# Patient Record
Sex: Male | Born: 1955
Health system: Southern US, Community
[De-identification: ages and names within clinical notes are randomized; demographics above are authoritative.]

## PROBLEM LIST (undated history)

## (undated) DIAGNOSIS — K2970 Gastritis, unspecified, without bleeding: Secondary | ICD-10-CM

## (undated) DIAGNOSIS — K219 Gastro-esophageal reflux disease without esophagitis: Secondary | ICD-10-CM

## (undated) DIAGNOSIS — R0989 Other specified symptoms and signs involving the circulatory and respiratory systems: Secondary | ICD-10-CM

## (undated) DIAGNOSIS — M51379 Other intervertebral disc degeneration, lumbosacral region without mention of lumbar back pain or lower extremity pain: Secondary | ICD-10-CM

## (undated) DIAGNOSIS — K279 Peptic ulcer, site unspecified, unspecified as acute or chronic, without hemorrhage or perforation: Secondary | ICD-10-CM

## (undated) DIAGNOSIS — M5137 Other intervertebral disc degeneration, lumbosacral region: Secondary | ICD-10-CM

## (undated) HISTORY — DX: Peptic ulcer, site unspecified, unspecified as acute or chronic, without hemorrhage or perforation: K27.9

## (undated) HISTORY — PX: COLONOSCOPY: SHX174

## (undated) HISTORY — PX: ESOPHAGOGASTRODUODENOSCOPY: SHX1529

## (undated) HISTORY — PX: BACK SURGERY: SHX140

## (undated) HISTORY — DX: Gastro-esophageal reflux disease without esophagitis: K21.9

## (undated) HISTORY — DX: Gastritis, unspecified, without bleeding: K29.70

## (undated) HISTORY — DX: Other specified symptoms and signs involving the circulatory and respiratory systems: R09.89

---

## 1979-01-05 HISTORY — PX: BILROTH II PROCEDURE: SHX1232

## 1999-11-17 ENCOUNTER — Encounter (INDEPENDENT_AMBULATORY_CARE_PROVIDER_SITE_OTHER): Payer: Self-pay | Admitting: Gastroenterology

## 1999-11-17 ENCOUNTER — Encounter (INDEPENDENT_AMBULATORY_CARE_PROVIDER_SITE_OTHER): Payer: Self-pay

## 1999-11-17 ENCOUNTER — Other Ambulatory Visit: Admission: RE | Admit: 1999-11-17 | Discharge: 1999-11-17 | Payer: Self-pay | Admitting: Gastroenterology

## 2000-10-10 ENCOUNTER — Ambulatory Visit (HOSPITAL_COMMUNITY): Admission: RE | Admit: 2000-10-10 | Discharge: 2000-10-10 | Payer: Self-pay | Admitting: Unknown Physician Specialty

## 2000-10-10 ENCOUNTER — Encounter: Payer: Self-pay | Admitting: Unknown Physician Specialty

## 2000-10-12 ENCOUNTER — Encounter: Admission: RE | Admit: 2000-10-12 | Discharge: 2000-10-12 | Payer: Self-pay | Admitting: Neurosurgery

## 2000-10-12 ENCOUNTER — Encounter: Payer: Self-pay | Admitting: Neurosurgery

## 2000-10-13 ENCOUNTER — Encounter: Payer: Self-pay | Admitting: Neurosurgery

## 2000-10-13 ENCOUNTER — Ambulatory Visit (HOSPITAL_COMMUNITY): Admission: RE | Admit: 2000-10-13 | Discharge: 2000-10-14 | Payer: Self-pay | Admitting: Neurosurgery

## 2000-11-09 ENCOUNTER — Ambulatory Visit (HOSPITAL_COMMUNITY): Admission: RE | Admit: 2000-11-09 | Discharge: 2000-11-09 | Payer: Self-pay | Admitting: Neurosurgery

## 2000-11-09 ENCOUNTER — Encounter: Payer: Self-pay | Admitting: Neurosurgery

## 2000-12-06 ENCOUNTER — Encounter (HOSPITAL_COMMUNITY): Admission: RE | Admit: 2000-12-06 | Discharge: 2001-01-05 | Payer: Self-pay | Admitting: Neurosurgery

## 2001-01-05 ENCOUNTER — Encounter (HOSPITAL_COMMUNITY): Admission: RE | Admit: 2001-01-05 | Discharge: 2001-02-04 | Payer: Self-pay | Admitting: Neurosurgery

## 2004-07-21 ENCOUNTER — Ambulatory Visit (HOSPITAL_COMMUNITY): Admission: RE | Admit: 2004-07-21 | Discharge: 2004-07-21 | Payer: Self-pay | Admitting: Family Medicine

## 2004-07-22 ENCOUNTER — Ambulatory Visit (HOSPITAL_COMMUNITY): Admission: RE | Admit: 2004-07-22 | Discharge: 2004-07-22 | Payer: Self-pay | Admitting: Family Medicine

## 2005-10-17 ENCOUNTER — Emergency Department (HOSPITAL_COMMUNITY): Admission: EM | Admit: 2005-10-17 | Discharge: 2005-10-17 | Payer: Self-pay | Admitting: *Deleted

## 2006-07-06 IMAGING — NM NM HEPATO W/GB/PHARM/[PERSON_NAME]
2 series · 2 of 2 positions shown · non-contrast
Comparison: none

HISTORY: Abdominal pain

[Series 1: gb hepatobiliary scan · 3.27mm/px · 1 of 1 slices shown (1 of 2)]
[im 1/1]
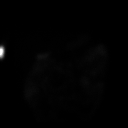

[Series 1: gb hepatobiliary scan · 3.27mm/px · 1 of 1 slices shown (2 of 2)]
[im 1/1]
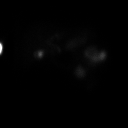

[2 of 2 positions shown; findings below may reference images not displayed]

HEPATOBILIARY SCAN WITH EJECTION FRACTION:

Hepatobiliary imaging performed using 5 mCi 3c-FFm mebrofenin.

Prompt tracer extraction from blood stream, indicating normal hepatocellular
function.
Prompt excretion into biliary tree.
Gallbladder visualized x 21 minutes.
Bowel activity seen x 30 minutes.
Duodenogastric biliary reflux noted.
No tracer retention within liver.

At 1 hour, patient ingested half-and-half, and imaging was continued for 60
minutes.
Mild emptying of tracer from gallbladder following fatty meal stimulation.
Calculated gallbladder ejection fraction is mildly decreased at 40%, although
this may be underestimated due to superimposed bowel activity.
IMPRESSION: Patent biliary tree.
Mildly decreased gallbladder ejection fraction of 40%, although this may be an
underestimation due to superimposed bowel activity at conclusion of the study.
Findings are probably equivocal for impaired gallbladder emptying.

## 2008-01-02 ENCOUNTER — Ambulatory Visit (HOSPITAL_COMMUNITY): Admission: RE | Admit: 2008-01-02 | Discharge: 2008-01-02 | Payer: Self-pay | Admitting: Family Medicine

## 2008-09-04 ENCOUNTER — Ambulatory Visit: Payer: Self-pay | Admitting: Internal Medicine

## 2008-09-04 DIAGNOSIS — K219 Gastro-esophageal reflux disease without esophagitis: Secondary | ICD-10-CM | POA: Insufficient documentation

## 2008-10-07 ENCOUNTER — Encounter: Payer: Self-pay | Admitting: Internal Medicine

## 2008-10-07 ENCOUNTER — Ambulatory Visit (HOSPITAL_COMMUNITY): Admission: RE | Admit: 2008-10-07 | Discharge: 2008-10-07 | Payer: Self-pay | Admitting: Internal Medicine

## 2008-10-07 ENCOUNTER — Ambulatory Visit: Payer: Self-pay | Admitting: Internal Medicine

## 2008-10-09 ENCOUNTER — Encounter: Payer: Self-pay | Admitting: Internal Medicine

## 2009-09-12 ENCOUNTER — Ambulatory Visit (HOSPITAL_COMMUNITY): Admission: RE | Admit: 2009-09-12 | Discharge: 2009-09-12 | Payer: Self-pay | Admitting: Family Medicine

## 2010-05-22 NOTE — Op Note (Signed)
[place]. [facility]  Patient:    Peter Williams, Peter Williams Visit Number: [id number] MRN: [id number]          Service Type: DSU Location: 3000 3041 01 Attending Physician:  Danella Penton Dictated by:   Tanya Nones. Jeral Fruit, M.D. Proc. Date: 10/13/00 Admit Date:  10/13/2000                             Operative Report  PREOPERATIVE DIAGNOSIS:  Right L5-S1 herniated disk intra- and extraforaminal.  POSTOPERATIVE DIAGNOSIS:  Right L5-S1 herniated disk intra- and extraforaminal.  OPERATION PERFORMED:  Right L5-S1 diskectomy, foraminotomy, decompression of the L5 and S1 nerve root.  Microscope.  SURGEON:  Tanya Nones. Jeral Fruit, M.D.  ASSISTANT:  Cristi Loron, M.D.  ANESTHESIA:  General.  INDICATIONS FOR PROCEDURE:  The patient was admitted because of back pain with radiation down to the right leg which is getting worse despite conservative treatment.  I saw him yesterday afternoon and the patient didnt want more conservative treatment.  He wanted to proceed with surgery.  The MRI showed herniated disk with intra- and extraforaminal component.  The ____________ was to go ahead and do intraforaminal first and then if the L5 nerve root was not totally compressed, to proceed to extraforaminal approach.  The risks were explained on history and physical.  DESCRIPTION OF PROCEDURE:  The patient was taken to the operating room and a midline incision from L5 to S1 was made.  The muscles were retracted laterally until right beyond the facet.   With the drill, we drilled the lower lamina of L5 and the upper of S1 as well as the medial facet.  With the help of the microscope we started our dissection of the  yellow ligament.  We identified the S1 nerve root and there was a modest stenosis which was taken care of with a foraminotomy and done with the Kerrison punch.  We found that the L5 nerve root was swollen and it was really absolutely tight mostly distal.  We  entered the disk space removing degenerative disk.  Using the Epstein curet, as well as the pituitary rongeurs we went laterally and removed quite a bit of degenerative disk compromising the L5 nerve root.  The same procedure was done medially.  Using the surgical dynamic direct, we proceeded more laterally until using a large nerve hook we were beyond the pedicles.  We followed the L5 nerve root and it was absolutely free extraforaminal.  Because of that, we decided not to go extraforaminal.  At the end, we had good decompression. The area was irrigated.  Valsalva maneuver was negative.  Fentanyl and Depo-Medrol were left in the epidural space and the wound was closed with Vicryl and Steri-Strips. Dictated by:   Tanya Nones. Jeral Fruit, M.D. Attending Physician:  Danella Penton DD:  10/13/00 TD:  10/13/00 Job: [phone] [id number]

## 2010-05-22 NOTE — H&P (Signed)
[place]. [facility]  Patient:    Peter Williams, Peter Williams Visit Number: [id number] MRN: [id number]          Service Type: DSU Location: 3000 3041 01 Attending Physician:  Danella Penton Dictated by:   Tanya Nones. Jeral Fruit, M.D. Admit Date:  10/13/2000                           History and Physical  HISTORY OF PRESENT ILLNESS:  Peter Williams is a gentleman who was seen yesterday as an emergency because of right leg pain.  The pain started about five weeks ago and had been doing down to the right leg as well as weakness and numbness, and was getting worse.  Despite conservative treatment plus chiropractic manipulation, he is worse.  The patient had an MRI sent to [place], and he wanted to proceed with surgery as soon as possible.  PAST MEDICAL HISTORY:  Vagotomy as well as gastrojejunostomy for peptic ulcer disease in 1983.  FAMILY HISTORY:  Unremarkable.  SOCIAL HISTORY:  The patient does not drink.  He smokes a pack a day.  REVIEW OF SYSTEMS:  Positive for a history of back and right leg pain.  PHYSICAL EXAMINATION:  GENERAL:  The patient came to my office limping from the right leg.  He was quite miserable to the point that he was unable to sit.  HEENT:  Normal.  NECK:  Normal.  CHEST:  Lungs clear.  CARDIAC:  S1 normal.  ABDOMEN:  Normal.  EXTREMITIES:  Normal normal pulses.  NEUROLOGIC:  Cranial nerves tested and were normal.  Strength 5/5 except that he has weakness of plantar and dorsiflexion of the right foot.  Sensation:  He complains of numbness which involves mostly the toes and the upper side of the right foot.  Reflexes symmetrical, with decrease of the right ankle jerk. Straight leg raising:  Right side is positive at about 10 degrees, left side about 80 degrees.  IMAGING STUDIES:  The MRI showed that he has a herniated disk at the level of 5-1 on the right side with intra- and extraforaminal component.  CLINICAL IMPRESSION:  Right  L5-S1 herniated disk.  RECOMMENDATION:  This patient is being admitted for surgery.  The procedure will be a right L5-S1 diskectomy and probably extraforaminal approach.  The surgery was fully explained to him and his wife, as well as the risks, with infection, CSF leak, bleeding, no improvement whatsoever, and need for further surgery. Dictated by:   Tanya Nones. Jeral Fruit, M.D. Attending Physician:  Danella Penton DD:  10/13/00 TD:  10/13/00 Job: [phone] [id number]

## 2013-11-26 ENCOUNTER — Other Ambulatory Visit (HOSPITAL_COMMUNITY): Payer: Self-pay | Admitting: Family Medicine

## 2013-11-26 DIAGNOSIS — R0989 Other specified symptoms and signs involving the circulatory and respiratory systems: Secondary | ICD-10-CM

## 2013-12-03 ENCOUNTER — Ambulatory Visit (HOSPITAL_COMMUNITY)
Admission: RE | Admit: 2013-12-03 | Discharge: 2013-12-03 | Disposition: A | Discharge status: Home / Self Care | Payer: BC Managed Care – PPO | Source: Ambulatory Visit | Attending: Family Medicine | Admitting: Family Medicine

## 2013-12-03 DIAGNOSIS — R0989 Other specified symptoms and signs involving the circulatory and respiratory systems: Secondary | ICD-10-CM | POA: Insufficient documentation

## 2013-12-04 ENCOUNTER — Other Ambulatory Visit (HOSPITAL_COMMUNITY): Payer: Self-pay | Admitting: Family Medicine

## 2013-12-04 DIAGNOSIS — R0989 Other specified symptoms and signs involving the circulatory and respiratory systems: Secondary | ICD-10-CM

## 2014-05-23 ENCOUNTER — Encounter: Payer: Self-pay | Admitting: Internal Medicine

## 2014-07-11 ENCOUNTER — Ambulatory Visit (INDEPENDENT_AMBULATORY_CARE_PROVIDER_SITE_OTHER): Payer: BLUE CROSS/BLUE SHIELD | Admitting: Internal Medicine

## 2014-07-11 ENCOUNTER — Encounter: Payer: Self-pay | Admitting: Internal Medicine

## 2014-07-11 VITALS — BP 122/64 | HR 80 | Ht 69.75 in | Wt 194.4 lb

## 2014-07-11 DIAGNOSIS — K219 Gastro-esophageal reflux disease without esophagitis: Secondary | ICD-10-CM

## 2014-07-11 DIAGNOSIS — K439 Ventral hernia without obstruction or gangrene: Secondary | ICD-10-CM | POA: Diagnosis not present

## 2014-07-11 MED ORDER — OMEPRAZOLE-SODIUM BICARBONATE 40-1100 MG PO CAPS: 1.0000 | ORAL_CAPSULE | Freq: Every day | ORAL | Status: DC

## 2014-07-11 NOTE — Progress Notes (Signed)
Subjective:    Patient ID: Peter JuniperMarvin J Williams, male    DOB: Nov 12, 1955, 59 y.o.   MRN: [id number] Cc: reflux HPI Has been having heartburn and regurgitation/reflux especially t night x 7-8 months. Awakens at night and coughs and chokes. OTC Zegerid helps significantly. No dysphagia.Omeprazole did not. 2 Mountain dews 24 oz a day sips on them in day and w/ supper and in evening. No dysphagia, unintentional weight loss or new medications.  Has an abdominal wall hernia and has tenderness and soreness at times. Can aggravate with lifting and coughing. Thinks its larger. Had discussed w/ Dr. Phillips OdorGolding in past and he was told that when it does get symptomatic (wasn't) he had a hernia surgeon he trusted he could see.  GI ROS o/w negative. No Known Allergies No outpatient prescriptions prior to visit.   No facility-administered medications prior to visit.   Past Medical History  Diagnosis Date  . GERD (gastroesophageal reflux disease)   . Gastritis   . Bruit of right carotid artery   . Peptic ulcer disease    Past Surgical History  Procedure Laterality Date  . Esophagogastroduodenoscopy    . Colonoscopy    . Bilroth ii procedure  1981    bilroth II, revised with bile-reflux bypass  . Back surgery      S5 and L3 vertabrae repair   History   Social History  . Marital Status: Married    Spouse Name: N/A  . Number of Children: 1  . Years of Education: N/A   Occupational History  . hvac tech    Social History Main Topics  . Smoking status: Former Smoker -- 3.00 packs/day for 8 years    Types: Cigarettes    Quit date: 07/11/2006  . Smokeless tobacco: Not on file  . Alcohol Use: No  . Drug Use: Not on file  . Sexual Activity: Not on file   Other Topics Concern  . None   Social History Narrative   Divorced   1 daughter   Visual merchandiserHVAC tech at [employer]   2 My Dew daily   07/13/2014      Family History  Problem Relation Age of Onset  . Alzheimer's disease Mother   . Diabetes  Brother     Review of Systems + back pain all other ROS negative    Objective:   Physical Exam @BP  122/64 mmHg  Pulse 80  Ht 5' 9.75" (1.772 m)  Wt 194 lb 6 oz (88.168 kg)  BMI 28.08 kg/m2@  General:  Well-developed, well-nourished and in no acute distress Eyes:  anicteric. ENT:   Mouth and posterior pharynx free of lesions. + dentures Neck:   supple w/o thyromegaly or mass.  Lungs: Clear to auscultation bilaterally. Heart:  S1S2, no rubs, murmurs, gallops. Abdomen:  soft, mildly tender midline to right of midline hernia - large egg sized and tender slightly but soft, no hepatosplenomegaly,  or mass and BS+.  Lymph:  no cervical or supraclavicular adenopathy. Extremities:   no edema, cyanosis or clubbing Skin   no rash. Neuro:  A&O x 3.  Psych:  appropriate mood and  Affect.   Data Reviewed: Prior EGD's, colonoscopy      Assessment & Plan:  Gastroesophageal reflux disease, esophagitis presence not specified - Plan: Ambulatory referral to Gastroenterology  Abdominal wall hernia - Plan: Ambulatory referral to General Surgery   1. Elevate HOB 2. Elimnate nocturnal Mt Dew 3. EGD to investigate given new or recurrent sxs and because  of prior gastrenterostomy. The risks and benefits as well as alternatives of endoscopic procedure(s) have been discussed and reviewed. All questions answered. The patient agrees to proceed. 4. Surgical referral Ovidio Kin re: abdominal wall hernia - patient ok w/ seeing surgeon here - may still discuss w/ Dr. Phillips Odor 5. Hernia info sheet and discussed signs/sxs incarceration and that it was an urent situation and to go to ED 6. ? If he could have some partial obstruction leading to this regurgitation - ? Could be related to hernia. Consider CT  WU:[id number], Chancy Hurter, MD Ovidio Kin, MD

## 2014-07-11 NOTE — Patient Instructions (Addendum)
   Please eliminate night time Mountain dew. Elevate head of bed - read reflux handouts  Start prescription Zegerid and use OTC Zegerid at bedtime as needed.  Please read handouts on GERD and hernia.  You have been scheduled for an endoscopy. Please follow written instructions given to you at your visit today. If you use inhalers (even only as needed), please bring them with you on the day of your procedure.  You have been scheduled for an appointment with Dr. [facility]vid[facility]avid [facility]. Your appointment is on 08/08/14 at 9:45AM. Please arrive at 9:15AM for registration. Make certain to bring a list of current medications, including any over the counter medications or vitamins. Also bring your co-pay if you have one as well as your insurance cards. [facility] is located at [address], [address]. Should you need to reschedule your appointment, please contact them at [phone].   I appreciate the opportunity to care for you. Stan Headarl Ashaya Raftery, MD, [facility]

## 2014-07-13 ENCOUNTER — Encounter: Payer: Self-pay | Admitting: Internal Medicine

## 2014-08-11 ENCOUNTER — Emergency Department (HOSPITAL_COMMUNITY)
Admission: EM | Admit: 2014-08-11 | Discharge: 2014-08-11 | Disposition: A | Discharge status: Home / Self Care | Payer: BLUE CROSS/BLUE SHIELD | Attending: Emergency Medicine | Admitting: Emergency Medicine

## 2014-08-11 ENCOUNTER — Encounter (HOSPITAL_COMMUNITY): Payer: Self-pay | Admitting: *Deleted

## 2014-08-11 DIAGNOSIS — Z87891 Personal history of nicotine dependence: Secondary | ICD-10-CM | POA: Diagnosis not present

## 2014-08-11 DIAGNOSIS — Y939 Activity, unspecified: Secondary | ICD-10-CM | POA: Diagnosis not present

## 2014-08-11 DIAGNOSIS — S39012A Strain of muscle, fascia and tendon of lower back, initial encounter: Secondary | ICD-10-CM | POA: Insufficient documentation

## 2014-08-11 DIAGNOSIS — Y929 Unspecified place or not applicable: Secondary | ICD-10-CM | POA: Diagnosis not present

## 2014-08-11 DIAGNOSIS — Y999 Unspecified external cause status: Secondary | ICD-10-CM | POA: Insufficient documentation

## 2014-08-11 DIAGNOSIS — X58XXXA Exposure to other specified factors, initial encounter: Secondary | ICD-10-CM | POA: Insufficient documentation

## 2014-08-11 DIAGNOSIS — Z8679 Personal history of other diseases of the circulatory system: Secondary | ICD-10-CM | POA: Diagnosis not present

## 2014-08-11 DIAGNOSIS — K219 Gastro-esophageal reflux disease without esophagitis: Secondary | ICD-10-CM | POA: Diagnosis not present

## 2014-08-11 DIAGNOSIS — Z79899 Other long term (current) drug therapy: Secondary | ICD-10-CM | POA: Insufficient documentation

## 2014-08-11 DIAGNOSIS — Z8711 Personal history of peptic ulcer disease: Secondary | ICD-10-CM | POA: Insufficient documentation

## 2014-08-11 DIAGNOSIS — S3992XA Unspecified injury of lower back, initial encounter: Secondary | ICD-10-CM | POA: Diagnosis present

## 2014-08-11 MED ORDER — CYCLOBENZAPRINE HCL 10 MG PO TABS: 10.0000 mg | ORAL_TABLET | Freq: Two times a day (BID) | ORAL | Status: DC | PRN

## 2014-08-11 MED ORDER — KETOROLAC TROMETHAMINE 30 MG/ML IJ SOLN
60.0000 mg | Freq: Once | INTRAMUSCULAR | Status: AC
  Administered 2014-08-11: 60 mg via INTRAMUSCULAR
  Filled 2014-08-11: qty 2

## 2014-08-11 MED ORDER — IBUPROFEN 800 MG PO TABS: 800.0000 mg | ORAL_TABLET | Freq: Three times a day (TID) | ORAL | Status: DC

## 2014-08-11 NOTE — Discharge Instructions (Signed)
Back Pain, Adult Low back pain is very common. About 1 in 5 people have back pain.The cause of low back pain is rarely dangerous. The pain often gets better over time.About half of people with a sudden onset of back pain feel better in just 2 weeks. About 8 in 10 people feel better by 6 weeks.  CAUSES Some common causes of back pain include:  Strain of the muscles or ligaments supporting the spine.  Wear and tear (degeneration) of the spinal discs.  Arthritis.  Direct injury to the back. DIAGNOSIS Most of the time, the direct cause of low back pain is not known.However, back pain can be treated effectively even when the exact cause of the pain is unknown.Answering your caregiver's questions about your overall health and symptoms is one of the most accurate ways to make sure the cause of your pain is not dangerous. If your caregiver needs more information, he or she may order lab work or imaging tests (X-rays or MRIs).However, even if imaging tests show changes in your back, this usually does not require surgery. HOME CARE INSTRUCTIONS For many people, back pain returns.Since low back pain is rarely dangerous, it is often a condition that people can learn to manageon their own.   Remain active. It is stressful on the back to sit or stand in one place. Do not sit, drive, or stand in one place for more than 30 minutes at a time. Take short walks on level surfaces as soon as pain allows.Try to increase the length of time you walk each day.  Do not stay in bed.Resting more than 1 or 2 days can delay your recovery.  Do not avoid exercise or work.Your body is made to move.It is not dangerous to be active, even though your back may hurt.Your back will likely heal faster if you return to being active before your pain is gone.  Pay attention to your body when you bend and lift. Many people have less discomfortwhen lifting if they bend their knees, keep the load close to their bodies,and  avoid twisting. Often, the most comfortable positions are those that put less stress on your recovering back.  Find a comfortable position to sleep. Use a firm mattress and lie on your side with your knees slightly bent. If you lie on your back, put a pillow under your knees.  Only take over-the-counter or prescription medicines as directed by your caregiver. Over-the-counter medicines to reduce pain and inflammation are often the most helpful.Your caregiver may prescribe muscle relaxant drugs.These medicines help dull your pain so you can more quickly return to your normal activities and healthy exercise.  Put ice on the injured area.  Put ice in a plastic bag.  Place a towel between your skin and the bag.  Leave the ice on for 15-20 minutes, 03-04 times a day for the first 2 to 3 days. After that, ice and heat may be alternated to reduce pain and spasms.  Ask your caregiver about trying back exercises and gentle massage. This may be of some benefit.  Avoid feeling anxious or stressed.Stress increases muscle tension and can worsen back pain.It is important to recognize when you are anxious or stressed and learn ways to manage it.Exercise is a great option. SEEK MEDICAL CARE IF:  You have pain that is not relieved with rest or medicine.  You have pain that does not improve in 1 week.  You have new symptoms.  You are generally not feeling well. SEEK   IMMEDIATE MEDICAL CARE IF:   You have pain that radiates from your back into your legs.  You develop new bowel or bladder control problems.  You have unusual weakness or numbness in your arms or legs.  You develop nausea or vomiting.  You develop abdominal pain.  You feel faint. Document Released: 12/21/2004 Document Revised: 06/22/2011 Document Reviewed: 04/24/2013 ExitCare Patient Information 2015 [employer]. This information is not intended to replace advice given to you by your health care provider. Make sure you  discuss any questions you have with your health care provider.  

## 2014-08-11 NOTE — ED Provider Notes (Signed)
CSN: [id number]     Arrival date & time 08/11/14  1015 History  This chart was scribed for non-physician practitioner working with Lyndal Pulley, MD by Leone Payor, ED Scribe. This patient was seen in room TR05C/TR05C and the patient's care was started at 10:29 AM.    Chief Complaint  Patient presents with  . Back Pain   The history is provided by the patient. No language interpreter was used.     HPI Comments: Peter Williams is a 59 y.o. male who presents to the Emergency Department complaining of 1 day of constant, gradually worsened pain to bilateral mid back down to the bilateral hips. He denies recent falls or trauma prior to onset of this episode. He reports history of back surgery in the early 2000's. He has taken 2 Aleve this morning without significant relief. He denies numbness, weakness, bowel/bladder incontinence, sob, chest pain, or fever. Worse with movement  Past Medical History  Diagnosis Date  . GERD (gastroesophageal reflux disease)   . Gastritis   . Bruit of right carotid artery   . Peptic ulcer disease    Past Surgical History  Procedure Laterality Date  . Esophagogastroduodenoscopy    . Colonoscopy    . Bilroth ii procedure  1981    bilroth II, revised with bile-reflux bypass  . Back surgery      S5 and L3 vertabrae repair   Family History  Problem Relation Age of Onset  . Alzheimer's disease Mother   . Diabetes Brother    History  Substance Use Topics  . Smoking status: Former Smoker -- 3.00 packs/day for 8 years    Types: Cigarettes    Quit date: 07/11/2006  . Smokeless tobacco: Not on file  . Alcohol Use: No    Review of Systems  Constitutional: Negative for fever.  Musculoskeletal: Positive for back pain.  Neurological: Negative for weakness and numbness.  All other systems reviewed and are negative.     Allergies  Review of patient's allergies indicates no known allergies.  Home Medications   Prior to Admission medications   Medication  Sig Start Date End Date Taking? Authorizing Provider  Ascorbic Acid (VITAMIN C) 1000 MG tablet Take 1,000 mg by mouth daily.    Historical Provider, MD  Omeprazole-Sodium Bicarbonate (ZEGERID OTC PO) Take 20 mg by mouth at bedtime as needed.    Historical Provider, MD  omeprazole-sodium bicarbonate (ZEGERID) 40-1100 MG per capsule Take 1 capsule by mouth daily before breakfast. 07/11/14   Iva Boop, MD   BP 133/83 mmHg  Pulse 78  Temp(Src) 97.7 F (36.5 C) (Oral)  Resp 18  Ht 6' (1.829 m)  Wt 196 lb (88.905 kg)  BMI 26.58 kg/m2  SpO2 100% Physical Exam  Constitutional: He is oriented to person, place, and time. He appears well-developed and well-nourished. No distress.  HENT:  Head: Normocephalic and atraumatic.  Eyes: Conjunctivae and EOM are normal.  Neck: Neck supple. No tracheal deviation present.  Cardiovascular: Normal rate.   Pulmonary/Chest: Effort normal. No respiratory distress.  Musculoskeletal: Normal range of motion.  Tender to lumbar spine and bilateral paraspinal area. Good strength and sensation to the area  Neurological: He is alert and oriented to person, place, and time. He exhibits normal muscle tone. Coordination normal.  Skin: Skin is warm and dry.  Psychiatric: He has a normal mood and affect. His behavior is normal.  Nursing note and vitals reviewed.   ED Course  Procedures (including critical care time)  DIAGNOSTIC STUDIES: Oxygen Saturation is 100% on RA, normal by my interpretation.    COORDINATION OF CARE: 10:32 AM Discussed treatment plan with pt at bedside and pt agreed to plan.   Labs Review Labs Reviewed - No data to display  Imaging Review No results found.   EKG Interpretation None      MDM   Final diagnoses:  Lumbar strain, initial encounter    Considered dissection although seems more likely musculoskeletal. Discussed follow up with DR. Jeral Fruit for continued symptoms. Pt requesting spine injections. Discussed with that that  is not something that we do.  Will given ibuprofen and flexeril for symptomatic relief  I personally performed the services described in this documentation, which was scribed in my presence. The recorded information has been reviewed and is accurate.   Teressa Lower, NP 08/11/14 1048  Lyndal Pulley, MD 08/11/14 [phone]

## 2014-08-11 NOTE — ED Notes (Signed)
Pt reports pain to mid back to hips, started yesterday. Hx of same and back surgery. Ambulatory at triage.

## 2014-08-11 NOTE — ED Notes (Signed)
Declined W/C at D/C and was escorted to lobby by RN. 

## 2014-08-20 ENCOUNTER — Ambulatory Visit (AMBULATORY_SURGERY_CENTER): Payer: BLUE CROSS/BLUE SHIELD | Admitting: Internal Medicine

## 2014-08-20 ENCOUNTER — Encounter: Payer: Self-pay | Admitting: Internal Medicine

## 2014-08-20 VITALS — BP 75/45 | HR 71 | Temp 97.0°F | Resp 13 | Ht 69.0 in | Wt 194.0 lb

## 2014-08-20 DIAGNOSIS — K219 Gastro-esophageal reflux disease without esophagitis: Secondary | ICD-10-CM

## 2014-08-20 DIAGNOSIS — K2960 Other gastritis without bleeding: Secondary | ICD-10-CM

## 2014-08-20 MED ORDER — SODIUM CHLORIDE 0.9 % IV SOLN: 500.0000 mL | INTRAVENOUS | Status: DC

## 2014-08-20 MED ORDER — METOCLOPRAMIDE HCL 10 MG PO TABS
10.0000 mg | ORAL_TABLET | Freq: Every day | ORAL | Status: DC
Start: 2014-08-20 — End: 2014-12-14

## 2014-08-20 NOTE — Progress Notes (Signed)
Transferred to recovery room. A/O x3, pleased with MAC.  VSS.  Report to Jane, RN. 

## 2014-08-20 NOTE — Patient Instructions (Addendum)
There is gastritis as I would expect after your surgery. Bile reflux into the stomach causes it.  All else ok.  I am starting metaclopramide at bedtime - it empties the stomach and tightens the junction of the esophagus and stomach. Please avoid eating and drinking at least 3 hours before bedtime and keep the head of your bed raised 4-6 inches.  Please call soon and make an appointment to see me in October.  I appreciate the opportunity to care for you.  Iva Boop, MD, FACG   YOU HAD AN ENDOSCOPIC PROCEDURE TODAY AT THE [facility]:   Refer to the procedure report that was given to you for any specific questions about what was found during the examination.  If the procedure report does not answer your questions, please call your gastroenterologist to clarify.  If you requested that your care partner not be given the details of your procedure findings, then the procedure report has been included in a sealed envelope for you to review at your convenience later.  YOU SHOULD EXPECT: Some feelings of bloating in the abdomen. Passage of more gas than usual.  Walking can help get rid of the air that was put into your GI tract during the procedure and reduce the bloating. If you had a lower endoscopy (such as a colonoscopy or flexible sigmoidoscopy) you may notice spotting of blood in your stool or on the toilet paper. If you underwent a bowel prep for your procedure, you may not have a normal bowel movement for a few days.  Please Note:  You might notice some irritation and congestion in your nose or some drainage.  This is from the oxygen used during your procedure.  There is no need for concern and it should clear up in a day or so.  SYMPTOMS TO REPORT IMMEDIATELY:   Following upper endoscopy (EGD)  Vomiting of blood or coffee ground material  New chest pain or pain under the shoulder blades  Painful or persistently difficult swallowing  New shortness of  breath  Fever of 100F or higher  Black, tarry-looking stools  For urgent or emergent issues, a gastroenterologist can be reached at any hour by calling [phone].   DIET: Your first meal following the procedure should be a small meal and then it is ok to progress to your normal diet. Heavy or fried foods are harder to digest and may make you feel nauseous or bloated.  Likewise, meals heavy in dairy and vegetables can increase bloating.  Drink plenty of fluids but you should avoid alcoholic beverages for 24 hours.  ACTIVITY:  You should plan to take it easy for the rest of today and you should NOT DRIVE or use heavy machinery until tomorrow (because of the sedation medicines used during the test).    FOLLOW UP: Our staff will call the number listed on your records the next business day following your procedure to check on you and address any questions or concerns that you may have regarding the information given to you following your procedure. If we do not reach you, we will leave a message.  However, if you are feeling well and you are not experiencing any problems, there is no need to return our call.  We will assume that you have returned to your regular daily activities without incident.  If any biopsies were taken you will be contacted by phone or by letter within the next 1-3 weeks.  Please call us at [phone] if you have not heard about the biopsies in 3 weeks.    SIGNATURES/CONFIDENTIALITY: You and/or your care partner have signed paperwork which will be entered into your electronic medical record.  These signatures attest to the fact that that the information above on your After Visit Summary has been reviewed and is understood.  Full responsibility of the confidentiality of this discharge information lies with you and/or your care-partner.   Gastritis information given.

## 2014-08-20 NOTE — Op Note (Signed)
[facility] [address] [place], [address]   ENDOSCOPY PROCEDURE REPORT  PATIENT: Peter Williams, Peter Williams  MR#: [id number] BIRTHDATE: 1955/01/28 , 59  yrs. old GENDER: male ENDOSCOPIST: Iva Boop, MD, [facility] PROCEDURE DATE:  08/20/2014 PROCEDURE:  EGD, diagnostic ASA CLASS:     Class I INDICATIONS:  regurgitation and reflux. MEDICATIONS: Propofol 160 TOPICAL ANESTHETIC: none  DESCRIPTION OF PROCEDURE: After the risks benefits and alternatives of the procedure were thoroughly explained, informed consent was obtained.  The LB [id number] endoscope was introduced through the mouth and advanced to the proximal jejunum , Without limitations.  The instrument was slowly withdrawn as the mucosa was fully examined.    1) s/p Bilroth II with expected bile gastritis 2) otherwise normal exam.  Retroflexed views revealed as previously described.     The scope was then withdrawn from the patient and the procedure completed.  COMPLICATIONS: There were no immediate complications.  ENDOSCOPIC IMPRESSION: 1) s/p Bilroth II with expected bile gastritis 2) otherwise normal exam  RECOMMENDATIONS: Trial of bedtime metaclopramide 10 mg and reflux precautions to continue Call soon to make appointment to see me in October   eSigned:  Iva Boop, MD, [facility] 08/20/2014 10:08 AM    CC: Dr. Assunta Found and The Patient

## 2014-08-21 ENCOUNTER — Telehealth: Payer: Self-pay | Admitting: *Deleted

## 2014-08-21 NOTE — Telephone Encounter (Signed)
  Follow up Call-  Call back number 08/20/2014  Post procedure Call Back phone  # [phone]  Permission to leave phone message Yes     Patient questions:  Do you have a fever, pain , or abdominal swelling? No. Pain Score  0 *  Have you tolerated food without any problems? Yes.    Have you been able to return to your normal activities? Yes.    Do you have any questions about your discharge instructions: Diet   No. Medications  No. Follow up visit  No.  Do you have questions or concerns about your Care? No.  Actions: * If pain score is 4 or above: No action needed, pain <4.

## 2014-10-11 ENCOUNTER — Telehealth: Payer: Self-pay | Admitting: Internal Medicine

## 2014-10-11 ENCOUNTER — Ambulatory Visit: Payer: BLUE CROSS/BLUE SHIELD | Admitting: Internal Medicine

## 2014-10-11 NOTE — Telephone Encounter (Signed)
No Charge 

## 2014-10-11 NOTE — Telephone Encounter (Signed)
Patient had to cancel this appointment. He could not get off of work. He will call back to reschedule. Do you want to charge late cancellation fee? Thanks.

## 2014-12-14 ENCOUNTER — Other Ambulatory Visit: Payer: Self-pay | Admitting: Internal Medicine

## 2015-02-09 ENCOUNTER — Other Ambulatory Visit: Payer: Self-pay | Admitting: Internal Medicine

## 2015-03-21 ENCOUNTER — Telehealth: Payer: Self-pay

## 2015-03-21 NOTE — Telephone Encounter (Signed)
Left message for patient to call me back to discuss his zegerid rx, got a prior authorization.  Need to know if he is on the rx kind or OTC zegerid and what else has he tried.

## 2015-03-21 NOTE — Telephone Encounter (Signed)
Patient called back and he is on the prescription zegerid because insurance wouldn't cover the OTC type.  Only other rx he has tried is zantac OTC.  Will do cover my meds and await there answer.

## 2015-03-24 NOTE — Telephone Encounter (Signed)
Checked the covermymeds account and no response from them yet.

## 2015-03-25 NOTE — Telephone Encounter (Signed)
Zegerid has been denied.  They will cover esomeprazole magnesium, lansoprazole, omeprazole, pantoprazole sodium and rabeprazole.  He has to try and fail two of these to get zegerid approved.  Please advise Sir, thank you.

## 2015-03-28 MED ORDER — OMEPRAZOLE 40 MG PO CPDR: 40.0000 mg | DELAYED_RELEASE_CAPSULE | Freq: Every day | ORAL | Status: DC

## 2015-03-28 NOTE — Telephone Encounter (Signed)
Spoke to patient and he wants to try the omeprazole and will see Dr Leone PayorGessner in May.  He said the zegerid OTC cost too much.

## 2015-03-28 NOTE — Telephone Encounter (Signed)
Can try omeprazole 40 mg qAM breakfast x 1 year # 90 w/ 3 RF or 30/11  He can also purchase OTC Zegerid if desied

## 2015-04-11 ENCOUNTER — Other Ambulatory Visit: Payer: Self-pay | Admitting: Internal Medicine

## 2015-04-14 NOTE — Telephone Encounter (Signed)
Ok to refill Sir? Thank you. 

## 2015-04-14 NOTE — Telephone Encounter (Signed)
OK to refill x 3 but I will need to see him for more refills

## 2015-05-20 ENCOUNTER — Ambulatory Visit (INDEPENDENT_AMBULATORY_CARE_PROVIDER_SITE_OTHER): Payer: BLUE CROSS/BLUE SHIELD | Admitting: Internal Medicine

## 2015-05-20 ENCOUNTER — Encounter: Payer: Self-pay | Admitting: Internal Medicine

## 2015-05-20 VITALS — BP 122/70 | HR 76 | Ht 69.75 in | Wt 196.4 lb

## 2015-05-20 DIAGNOSIS — K432 Incisional hernia without obstruction or gangrene: Secondary | ICD-10-CM

## 2015-05-20 DIAGNOSIS — K219 Gastro-esophageal reflux disease without esophagitis: Secondary | ICD-10-CM | POA: Diagnosis not present

## 2015-05-20 MED ORDER — METOCLOPRAMIDE HCL 10 MG PO TABS: 10.0000 mg | ORAL_TABLET | Freq: Every day | ORAL | Status: DC

## 2015-05-20 NOTE — Progress Notes (Signed)
   Subjective:    Patient ID: Peter Williams, male    DOB: 09/02/1955, 60 y.o.   MRN: [id number] Cc: f/u reflux and heartburn and ? Of hernia HPI  No sig heartburn or reflux on current regimen of PPI and nocturnal Reglan. He has hx BII gastric surgery. He is asking about the ventral hernia and repair - he last saw me in 2016 and also saw Dr. Ovidio Kinavid Newman. He has some soreness at times but no sxs of incarceration. 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  Medications, allergies, past medical history, past surgical history, family history and social history are reviewed and updated in the EMR.  Review of Systems As above    Objective:   Physical Exam BP 122/70 mmHg  Pulse 76  Ht 5' 9.75" (1.772 m)  Wt 196 lb 6 oz (89.075 kg)  BMI 28.37 kg/m2 Small right paramedian mildly tender upper abd incisional hernia  No signs tardive dyskinesia    Assessment & Plan:   Encounter Diagnoses  Name Primary?  . Ventral incisional hernia Yes  . Gastroesophageal reflux disease, esophagitis presence not specified     Stay on PPI and reglan Se me 9-12 mos - needs f/u due to Reglan use even if Asx Dr. Ezzard StandingNewman to reassess - leans towards fixing hernia but concerned about how much time off  - patient will call for an appt   15 minutes time spent with patient > half in counseling coordination of care  QM:VHQIONGCc:GOLDING, Chancy HurterJOHN CABOT, MD

## 2015-05-20 NOTE — Patient Instructions (Signed)
  We have sent the following medications to your pharmacy for you to pick up at your convenience: reglan   Please follow up with Dr Ezzard StandingNewman regarding your hernia.   Follow up with Dr Leone PayorGessner in 6-12 months.    I appreciate the opportunity to care for you. Stan Headarl Gessner, MD, [facility]

## 2015-05-25 ENCOUNTER — Encounter: Payer: Self-pay | Admitting: Internal Medicine

## 2015-08-28 DIAGNOSIS — Z6826 Body mass index (BMI) 26.0-26.9, adult: Secondary | ICD-10-CM | POA: Diagnosis not present

## 2015-08-28 DIAGNOSIS — M549 Dorsalgia, unspecified: Secondary | ICD-10-CM | POA: Diagnosis not present

## 2015-08-28 DIAGNOSIS — R030 Elevated blood-pressure reading, without diagnosis of hypertension: Secondary | ICD-10-CM | POA: Diagnosis not present

## 2015-08-28 DIAGNOSIS — M5416 Radiculopathy, lumbar region: Secondary | ICD-10-CM | POA: Diagnosis not present

## 2015-09-04 DIAGNOSIS — M5416 Radiculopathy, lumbar region: Secondary | ICD-10-CM | POA: Diagnosis not present

## 2015-09-05 DIAGNOSIS — M47816 Spondylosis without myelopathy or radiculopathy, lumbar region: Secondary | ICD-10-CM | POA: Diagnosis not present

## 2015-09-05 DIAGNOSIS — M5416 Radiculopathy, lumbar region: Secondary | ICD-10-CM | POA: Diagnosis not present

## 2015-09-18 DIAGNOSIS — R030 Elevated blood-pressure reading, without diagnosis of hypertension: Secondary | ICD-10-CM | POA: Diagnosis not present

## 2015-09-18 DIAGNOSIS — M5416 Radiculopathy, lumbar region: Secondary | ICD-10-CM | POA: Diagnosis not present

## 2015-09-18 DIAGNOSIS — Z6827 Body mass index (BMI) 27.0-27.9, adult: Secondary | ICD-10-CM | POA: Diagnosis not present

## 2015-10-13 DIAGNOSIS — R030 Elevated blood-pressure reading, without diagnosis of hypertension: Secondary | ICD-10-CM | POA: Diagnosis not present

## 2015-10-13 DIAGNOSIS — Z6827 Body mass index (BMI) 27.0-27.9, adult: Secondary | ICD-10-CM | POA: Diagnosis not present

## 2015-10-13 DIAGNOSIS — M5416 Radiculopathy, lumbar region: Secondary | ICD-10-CM | POA: Diagnosis not present

## 2015-10-13 DIAGNOSIS — M5126 Other intervertebral disc displacement, lumbar region: Secondary | ICD-10-CM | POA: Diagnosis not present

## 2015-10-22 DIAGNOSIS — Z6827 Body mass index (BMI) 27.0-27.9, adult: Secondary | ICD-10-CM | POA: Diagnosis not present

## 2015-10-22 DIAGNOSIS — R030 Elevated blood-pressure reading, without diagnosis of hypertension: Secondary | ICD-10-CM | POA: Diagnosis not present

## 2015-10-22 DIAGNOSIS — M5416 Radiculopathy, lumbar region: Secondary | ICD-10-CM | POA: Diagnosis not present

## 2015-10-22 DIAGNOSIS — M5126 Other intervertebral disc displacement, lumbar region: Secondary | ICD-10-CM | POA: Diagnosis not present

## 2015-11-17 IMAGING — US US CAROTID DUPLEX BILAT
1 series · 13 of 24 positions shown · non-contrast
Comparison: No prior for comparison

CLINICAL DATA: 58-year-old male with right carotid bruit.

Cardiovascular risk factors include tobacco use.
EXAM:
BILATERAL CAROTID DUPLEX ULTRASOUND
TECHNIQUE: Gray scale imaging, color Doppler and duplex ultrasound were
performed of bilateral carotid and vertebral arteries in the neck.

[Series 1: us carotid duplex bilat · 0.07mm/px · 13 of 72 slices shown]
[im 1/72]
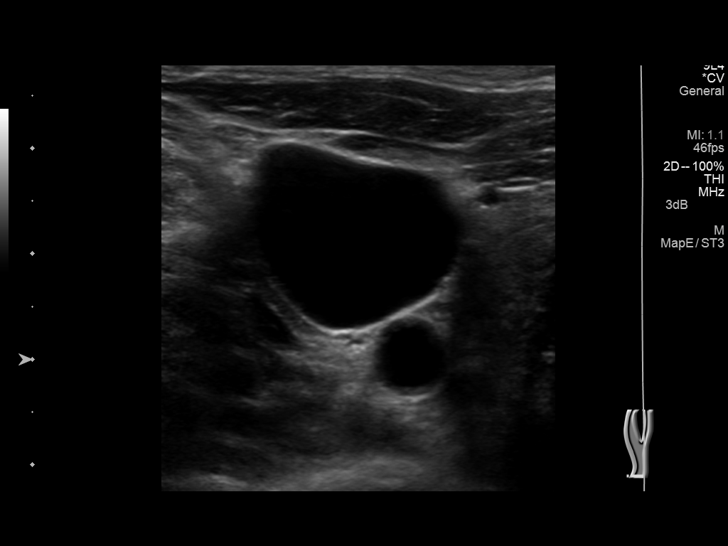
[im 7/72]
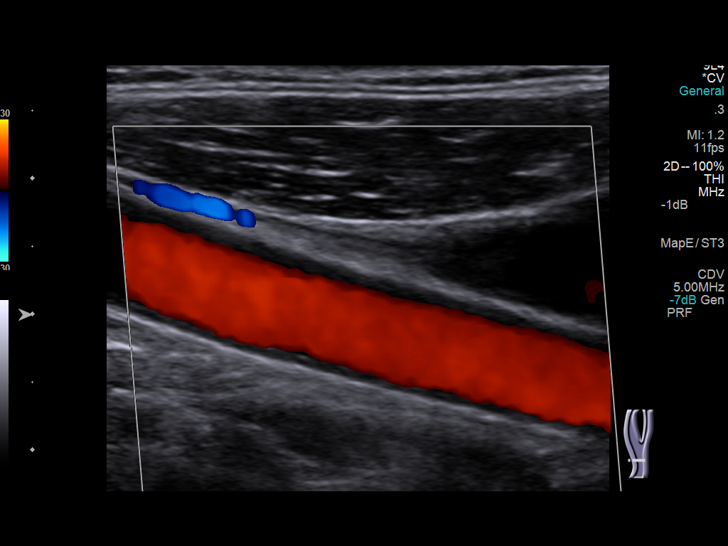
[im 13/72]
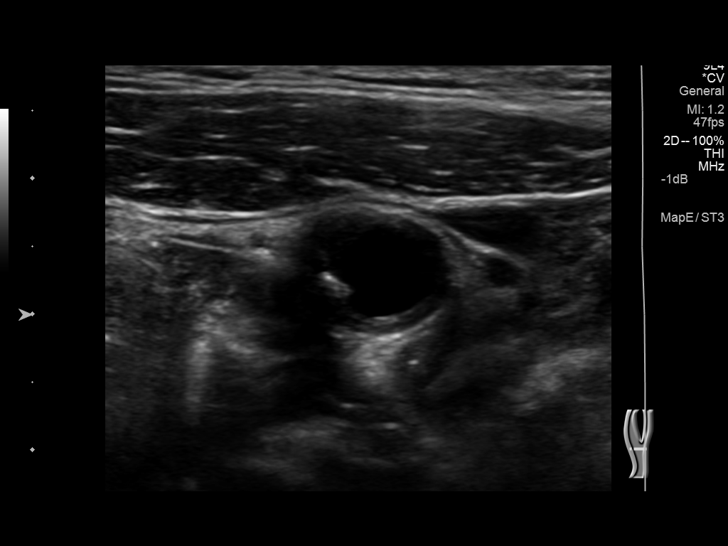
[im 19/72]
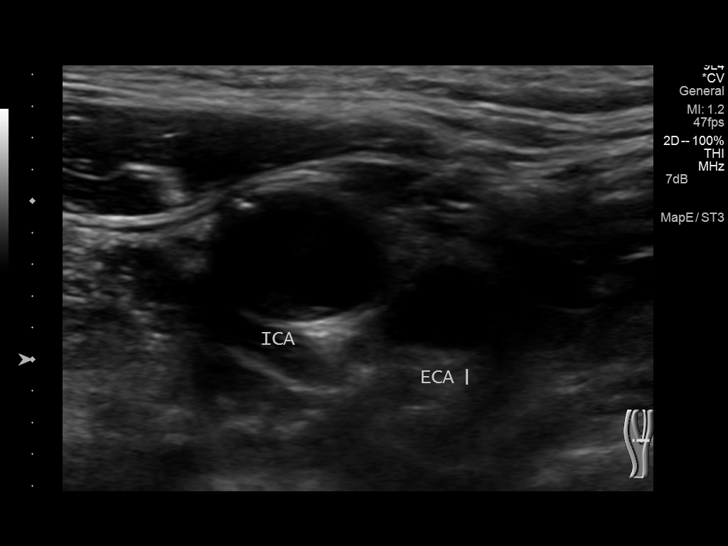
[im 25/72]
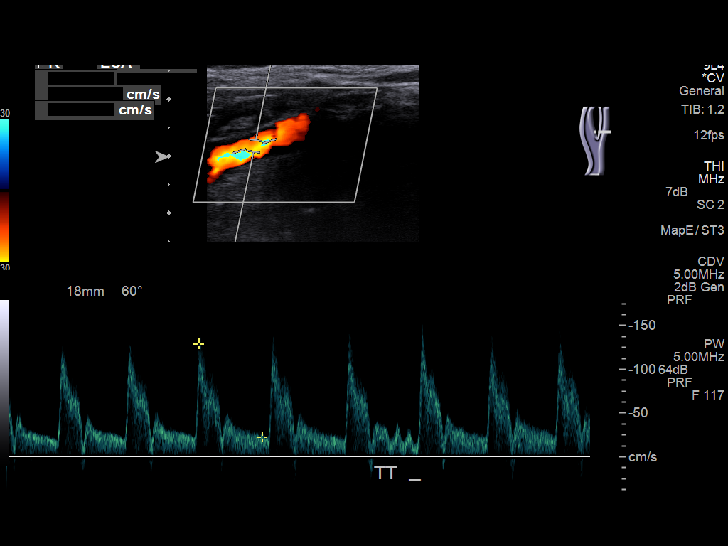
[im 31/72]
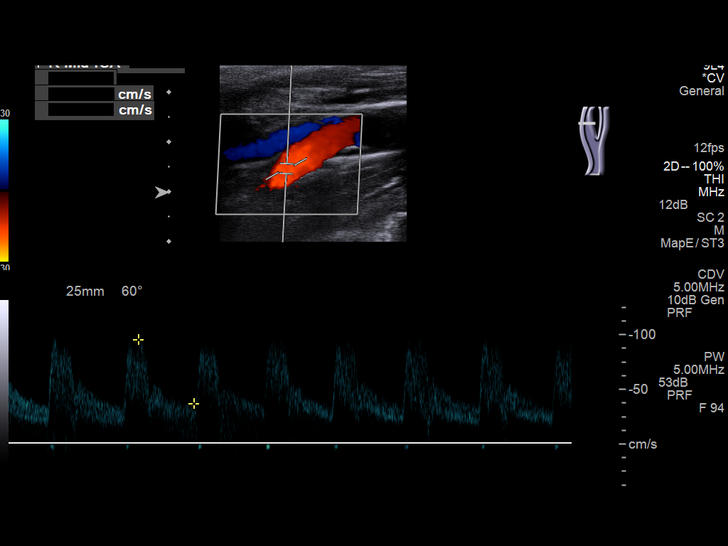
[im 38/72]
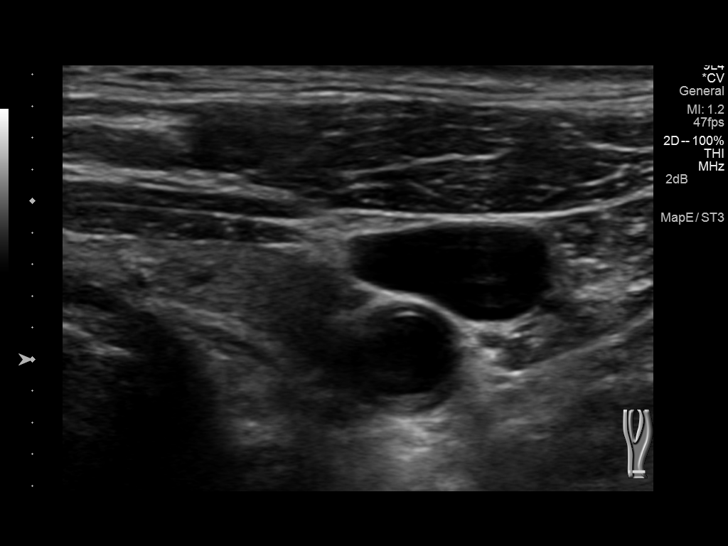
[im 41/72]
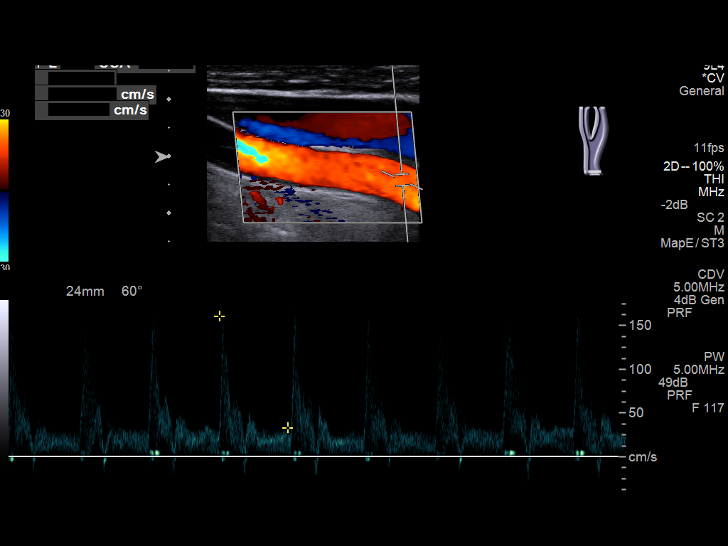
[im 47/72]
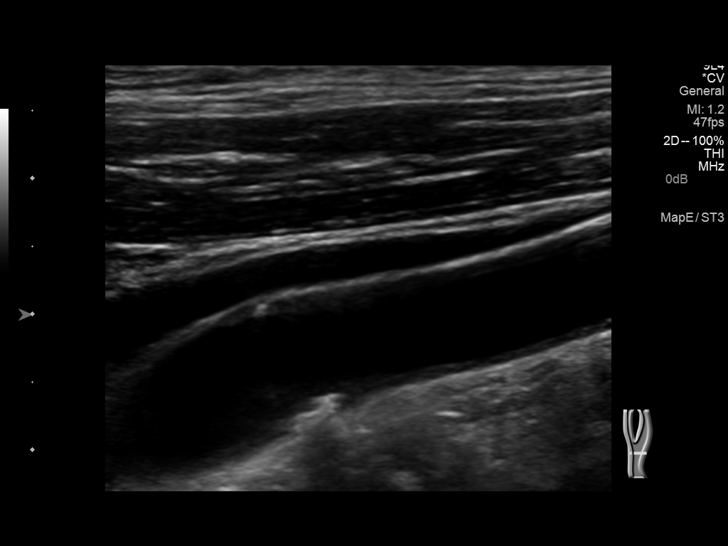
[im 53/72]
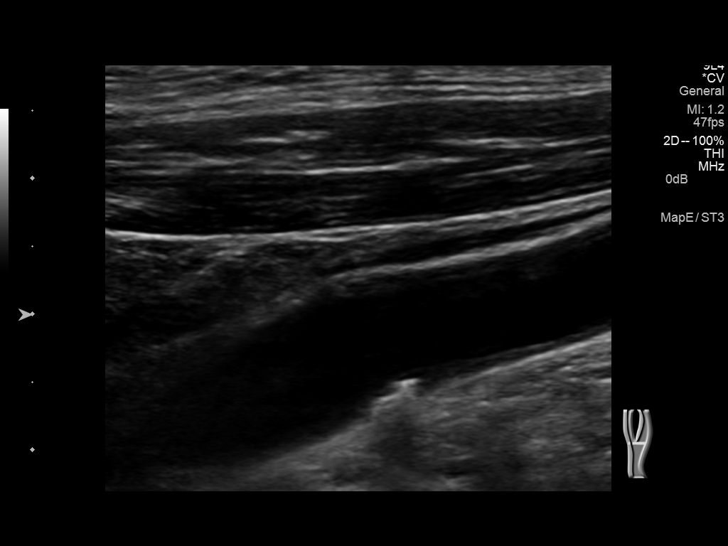
[im 59/72]
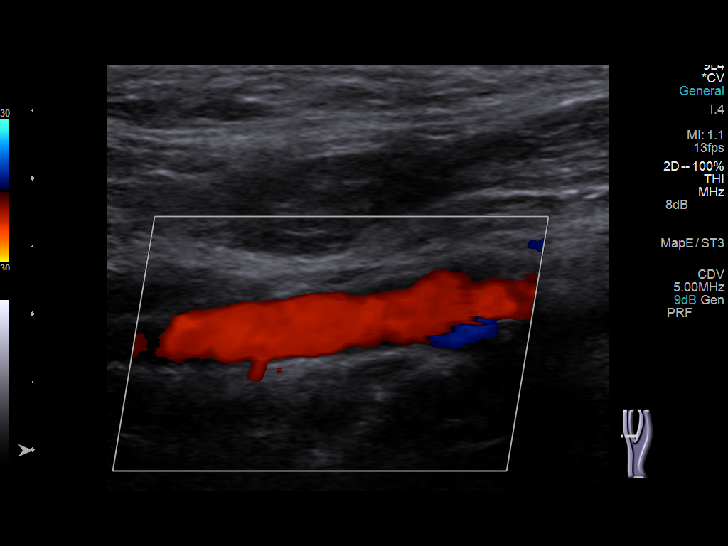
[im 65/72]
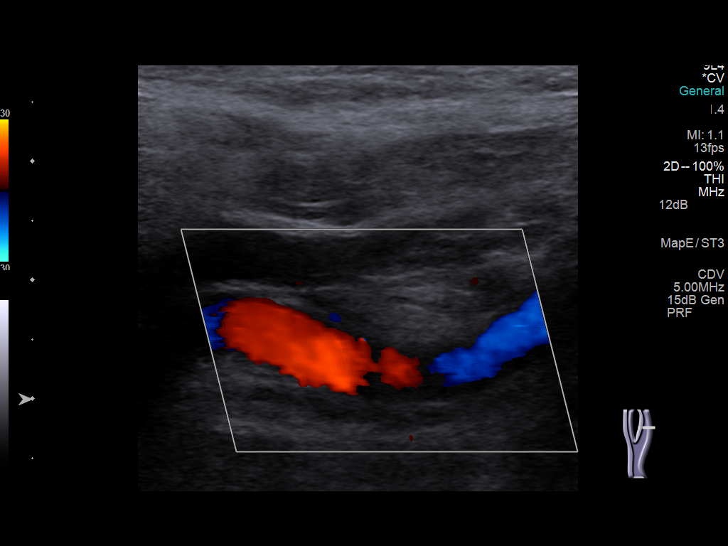
[im 72/72]
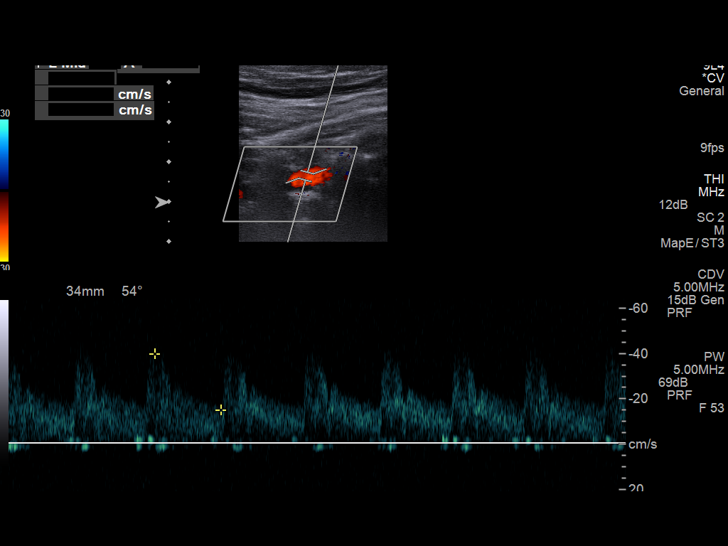

[13 of 24 positions shown; findings below may reference images not displayed]

FINDINGS: Criteria: Quantification of carotid stenosis is based on velocity
parameters that correlate the residual internal carotid diameter
with NASCET-based stenosis levels, using the diameter of the distal
internal carotid lumen as the denominator for stenosis measurement.

The following velocity measurements were obtained:

RIGHT

ICA:  Systolic 96 cm/sec, Diastolic 37 cm/sec

CCA:  102 cm/sec

SYSTOLIC ICA/CCA RATIO:

DIASTOLIC ICA/CCA RATIO:

ECA:  145 cm/sec

LEFT

ICA:  Systolic 126 cm/sec, Diastolic 24 cm/sec

CCA:  109 cm/sec

SYSTOLIC ICA/CCA RATIO:

DIASTOLIC ICA/CCA RATIO:

ECA:  93 cm/sec

RIGHT CAROTID ARTERY: Unremarkable appearance of the right common
carotid artery with intermediate waveform maintained. No significant
atherosclerotic disease with mild intimal thickening.

Mild heterogeneous plaque at the right carotid bifurcation extending
into the right ICA with some calcification. Right ICA maintains low
resistance waveform.

RIGHT VERTEBRAL ARTERY: Antegrade flow with low resistance waveform.

LEFT CAROTID ARTERY: Unremarkable appearance of the left common
carotid artery without significant atherosclerotic disease. Minimal
intimal thickening. Intermediate waveform maintained.

Mild heterogeneous atherosclerotic disease at the carotid
bifurcation extending into the left ICA with some calcifications.

Low resistance waveform of the left ICA.

LEFT VERTEBRAL ARTERY:  Antegrade flow with low resistance waveform.
IMPRESSION: Color duplex indicates minimal heterogeneous and calcified plaque,
with no hemodynamically significant stenosis by duplex criteria in
the extracranial cerebrovascular circulation.

## 2015-12-02 DIAGNOSIS — M5416 Radiculopathy, lumbar region: Secondary | ICD-10-CM | POA: Diagnosis not present

## 2015-12-02 DIAGNOSIS — M5126 Other intervertebral disc displacement, lumbar region: Secondary | ICD-10-CM | POA: Diagnosis not present

## 2016-02-16 ENCOUNTER — Other Ambulatory Visit: Payer: Self-pay | Admitting: Gastroenterology

## 2016-05-24 ENCOUNTER — Other Ambulatory Visit: Payer: Self-pay | Admitting: Internal Medicine

## 2016-05-26 DIAGNOSIS — E782 Mixed hyperlipidemia: Secondary | ICD-10-CM | POA: Diagnosis not present

## 2016-05-26 DIAGNOSIS — Z1389 Encounter for screening for other disorder: Secondary | ICD-10-CM | POA: Diagnosis not present

## 2016-05-26 DIAGNOSIS — Z0001 Encounter for general adult medical examination with abnormal findings: Secondary | ICD-10-CM | POA: Diagnosis not present

## 2016-05-26 DIAGNOSIS — K432 Incisional hernia without obstruction or gangrene: Secondary | ICD-10-CM | POA: Diagnosis not present

## 2016-05-26 DIAGNOSIS — Z6828 Body mass index (BMI) 28.0-28.9, adult: Secondary | ICD-10-CM | POA: Diagnosis not present

## 2016-05-28 DIAGNOSIS — D649 Anemia, unspecified: Secondary | ICD-10-CM | POA: Diagnosis not present

## 2016-05-28 DIAGNOSIS — R7309 Other abnormal glucose: Secondary | ICD-10-CM | POA: Diagnosis not present

## 2016-06-04 DIAGNOSIS — D649 Anemia, unspecified: Secondary | ICD-10-CM | POA: Diagnosis not present

## 2016-06-17 DIAGNOSIS — E538 Deficiency of other specified B group vitamins: Secondary | ICD-10-CM | POA: Diagnosis not present

## 2016-08-03 DIAGNOSIS — E538 Deficiency of other specified B group vitamins: Secondary | ICD-10-CM | POA: Diagnosis not present

## 2016-08-03 DIAGNOSIS — E663 Overweight: Secondary | ICD-10-CM | POA: Diagnosis not present

## 2016-08-03 DIAGNOSIS — Z1389 Encounter for screening for other disorder: Secondary | ICD-10-CM | POA: Diagnosis not present

## 2016-08-03 DIAGNOSIS — Z6829 Body mass index (BMI) 29.0-29.9, adult: Secondary | ICD-10-CM | POA: Diagnosis not present

## 2016-08-03 DIAGNOSIS — M25562 Pain in left knee: Secondary | ICD-10-CM | POA: Diagnosis not present

## 2016-08-03 DIAGNOSIS — M1712 Unilateral primary osteoarthritis, left knee: Secondary | ICD-10-CM | POA: Diagnosis not present

## 2016-08-20 ENCOUNTER — Other Ambulatory Visit: Payer: Self-pay | Admitting: Internal Medicine

## 2016-08-23 NOTE — Telephone Encounter (Signed)
May I refill Sir? 

## 2016-08-23 NOTE — Telephone Encounter (Signed)
OK to refill x 3 Have him set up an October appointment Will need to be seen for more beyond that

## 2016-08-23 NOTE — Telephone Encounter (Signed)
Sent in 3 months worth and noted for patient to call us for appointment in October.

## 2016-11-22 ENCOUNTER — Other Ambulatory Visit: Payer: Self-pay | Admitting: Internal Medicine

## 2016-11-23 NOTE — Telephone Encounter (Signed)
Ok to refill 

## 2016-11-24 NOTE — Telephone Encounter (Signed)
Needs to see me before we refill

## 2016-11-29 ENCOUNTER — Telehealth: Payer: Self-pay | Admitting: Internal Medicine

## 2016-11-29 MED ORDER — METOCLOPRAMIDE HCL 10 MG PO TABS: 10.0000 mg | ORAL_TABLET | Freq: Every day | ORAL | 0 refills | Status: DC

## 2016-11-29 NOTE — Telephone Encounter (Signed)
Okay to refill Sir, thanks.

## 2016-11-29 NOTE — Telephone Encounter (Signed)
X 2 mos

## 2016-11-29 NOTE — Telephone Encounter (Signed)
Metoclopramide refilled as requested. Patient has appointment in January.

## 2017-01-23 ENCOUNTER — Other Ambulatory Visit: Payer: Self-pay | Admitting: Internal Medicine

## 2017-01-25 ENCOUNTER — Encounter: Payer: Self-pay | Admitting: Internal Medicine

## 2017-01-25 ENCOUNTER — Ambulatory Visit: Payer: BLUE CROSS/BLUE SHIELD | Admitting: Internal Medicine

## 2017-01-25 NOTE — Telephone Encounter (Signed)
Refill for 3 mos ok

## 2017-01-25 NOTE — Telephone Encounter (Signed)
Patient's reglan refilled as approved and patient to come in and see Dr Leone PayorGessner on Friday. Patient informed that it has been sent in.

## 2017-01-25 NOTE — Telephone Encounter (Signed)
Please advise if okay to refill, had appointment for today which we are having to r/s due to a schedule change on our part.

## 2017-01-28 ENCOUNTER — Ambulatory Visit: Payer: BLUE CROSS/BLUE SHIELD | Admitting: Internal Medicine

## 2017-01-28 ENCOUNTER — Encounter: Payer: Self-pay | Admitting: Internal Medicine

## 2017-01-28 VITALS — BP 160/82 | HR 73 | Ht 73.0 in | Wt 211.0 lb

## 2017-01-28 DIAGNOSIS — K219 Gastro-esophageal reflux disease without esophagitis: Secondary | ICD-10-CM | POA: Diagnosis not present

## 2017-01-28 DIAGNOSIS — K432 Incisional hernia without obstruction or gangrene: Secondary | ICD-10-CM

## 2017-01-28 NOTE — Patient Instructions (Signed)
Try taking 2 tablespoons of Gaviscon daily at bedtime.  Please update Dr Leone PayorGessner in several weeks with how your doing.   We are giving you a handout to read and follow about raising the head of your bed and don't lie down too soon after eating.   [facility] your [facility].  They contact you with an appointment date/time.   Please follow up with Dr Leone PayorGessner in 2-3 months.    I appreciate the opportunity to care for you. Stan Headarl Gessner, MD, [facility]

## 2017-01-28 NOTE — Progress Notes (Signed)
   Peter Williams 61 y.o. [birth year] [id number]  Assessment & Plan:   Encounter Diagnoses  Name Primary?  . Gastroesophageal reflux disease, esophagitis presence not specified Yes  . Ventral incisional hernia     Try to increase time between last meal and lying down to at least 3 hrs Raise HOB if can Try Gaviscon 30 cc qhs - call me in 2 weeks w/ update - other options are carafate, nocturnal PPI or H2 B but is s/p B II and vagotomy and already on metaclopramide so seems less likely imcreased acid suppression is answer  He has gained weight and that may be part/all of problem (I did not discuss at the visit)  Refer to Dr. Ezzard StandingNewman to reassess hernia  ZD:GUYQIHKCc:Golding, Jonny RuizJohn, MD Dr. Ovidio Kinavid Newman   Subjective:   Chief Complaint:reflux  HPI Peter SangRusty is here w/ wife - 3 mos of nocturnal reflux/heartburn that disrupts sleep. Med regimen is stable. Eats [phone] and then goes to bed at 9 approx. Cannot sleep on a medical wedge, bed not elevated and is sleeping w/ 2 pillows. 1 cup coffee AM No dysphagia  Ventral hernia is enlarging and he is ? If it needs to be fixed though has concerns about time out of work and recurrence with liftiing (does a lot of lifting at work)  [employer] Readings from Last 3 Encounters:  01/28/17 211 lb (95.7 kg)  05/20/15 196 lb 6 oz (89.1 kg)  08/20/14 194 lb (88 kg)    No Known Allergies Current Meds  Medication Sig  . Ascorbic Acid (VITAMIN C) 1000 MG tablet Take 1,000 mg by mouth daily.  . Calcium Carb-Cholecalciferol (CALCIUM 600 + D PO) Take by mouth.  . metoCLOPramide (REGLAN) 10 MG tablet TAKE 1 TABLET BY MOUTH AT BEDTIME  . omeprazole (PRILOSEC) 40 MG capsule Take 1 capsule (40 mg total) by mouth daily. Take before breakfast.   Past Medical History:  Diagnosis Date  . Bruit of right carotid artery   . Gastritis   . GERD (gastroesophageal reflux disease)   . Peptic ulcer disease    Past Surgical History:  Procedure Laterality Date  . BACK SURGERY     S5 and L3 vertabrae repair  . BILROTH II PROCEDURE  1981   bilroth II, revised with bile-reflux bypass  . COLONOSCOPY    . 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   Review of Systems As above  Objective:   Physical Exam BP (!) 160/82   Pulse 73   Ht 6\' 1"  (1.854 m)   Wt 211 lb (95.7 kg)   BMI 27.84 kg/m  NAD No signs of tardive dyskinesia Abd - to right of midline in upper andomen there is a small baseball sized reducible ventral hernia  25 minutes time spent with patient > half in counseling coordination of care

## 2017-01-31 ENCOUNTER — Encounter: Payer: Self-pay | Admitting: Internal Medicine

## 2017-02-07 ENCOUNTER — Telehealth: Payer: Self-pay

## 2017-02-07 NOTE — Telephone Encounter (Signed)
[facility] regarding patients appointment with Dr Ezzard StandingNewman. They spoke with the patient and informed them that the appointment is 04/28/17 at 4:00pm, arrive at 3:40pm.

## 2017-02-17 ENCOUNTER — Telehealth: Payer: Self-pay | Admitting: Internal Medicine

## 2017-02-17 NOTE — Telephone Encounter (Signed)
Please advise Sir, thank you. 

## 2017-02-22 MED ORDER — SUCRALFATE 1 G PO TABS: 1.0000 g | ORAL_TABLET | Freq: Three times a day (TID) | ORAL | 1 refills | Status: DC

## 2017-02-22 NOTE — Telephone Encounter (Signed)
Returning your call. Can be reached at [phone].

## 2017-02-22 NOTE — Telephone Encounter (Signed)
Left message to call me back.

## 2017-02-22 NOTE — Telephone Encounter (Signed)
Stop Gaviscon  Carafate 1 gram tabs qAC and HS  # 120 1 Refill - If that works will do more refills  If not helping call back  If he can lose weight that he has gained could also help

## 2017-02-22 NOTE — Telephone Encounter (Signed)
I spoke with Peter Williams and he will stop the Gaviscon and start the carafate. He will call us back if this doesn't help. He has also bought bed raisers to try and help.

## 2017-04-24 ENCOUNTER — Other Ambulatory Visit: Payer: Self-pay | Admitting: Internal Medicine

## 2017-04-28 DIAGNOSIS — K432 Incisional hernia without obstruction or gangrene: Secondary | ICD-10-CM | POA: Diagnosis not present

## 2017-05-24 ENCOUNTER — Ambulatory Visit: Payer: Self-pay | Admitting: Surgery

## 2017-05-27 ENCOUNTER — Encounter (HOSPITAL_COMMUNITY): Payer: Self-pay

## 2017-05-27 NOTE — Patient Instructions (Addendum)
Your procedure is scheduled on:  Thursday, June 09, 2017   Surgery Time:  9:00AM-12Noon   Report to [facility] Main  Entrance    Report to admitting at 7:00 AM   Call this number if you have problems the morning of surgery [phone]   Do not eat food or drink liquids :After Midnight.   Do NOT smoke after Midnight   Complete one Ensure drink the morning of surgery by  6:00AM the day of surgery.   Take these medicines the morning of surgery with A SIP OF WATER: Omeprazole, Sucralfate                               You may not have any metal on your body including jewelry, and body piercings             Do not wear lotions, powders, perfumes/cologne, or deodorant                           Men may shave face and neck.   Do not bring valuables to the hospital. [facility] IS NOT             RESPONSIBLE   FOR VALUABLES.   Contacts, dentures or bridgework may not be worn into surgery.   Leave suitcase in the car. After surgery it may be brought to your room.   Special Instructions: Bring a copy of your healthcare power of attorney and living will documents         the day of surgery if you haven't scanned them in before.              Please read over the following fact sheets you were given:  [facility] - Preparing for Surgery Before surgery, you can play an important role.  Because skin is not sterile, your skin needs to be as free of germs as possible.  You can reduce the number of germs on your skin by washing with CHG (chlorahexidine gluconate) soap before surgery.  CHG is an antiseptic cleaner which kills germs and bonds with the skin to continue killing germs even after washing. Please DO NOT use if you have an allergy to CHG or antibacterial soaps.  If your skin becomes reddened/irritated stop using the CHG and inform your nurse when you arrive at Short Stay. Do not shave (including legs and underarms) for at least 48 hours prior to the first CHG shower.  You  may shave your face/neck.  Please follow these instructions carefully:  1.  Shower with CHG Soap the night before surgery and the  morning of surgery.  2.  If you choose to wash your hair, wash your hair first as usual with your normal  shampoo.  3.  After you shampoo, rinse your hair and body thoroughly to remove the shampoo.                             4.  Use CHG as you would any other liquid soap.  You can apply chg directly to the skin and wash.  Gently with a scrungie or clean washcloth.  5.  Apply the CHG Soap to your body ONLY FROM THE NECK DOWN.   Do   not use on face/ open  Wound or open sores. Avoid contact with eyes, ears mouth and   genitals (private parts).                       Wash face,  Genitals (private parts) with your normal soap.             6.  Wash thoroughly, paying special attention to the area where your    surgery  will be performed.  7.  Thoroughly rinse your body with warm water from the neck down.  8.  DO NOT shower/wash with your normal soap after using and rinsing off the CHG Soap.                9.  Pat yourself dry with a clean towel.            10.  Wear clean pajamas.            11.  Place clean sheets on your bed the night of your first shower and do not  sleep with pets. Day of Surgery : Do not apply any lotions/deodorants the morning of surgery.  Please wear clean clothes to the hospital/surgery center.  FAILURE TO FOLLOW THESE INSTRUCTIONS MAY RESULT IN THE CANCELLATION OF YOUR SURGERY  PATIENT SIGNATURE_________________________________  NURSE SIGNATURE__________________________________  ________________________________________________________________________

## 2017-06-02 ENCOUNTER — Other Ambulatory Visit: Payer: Self-pay

## 2017-06-02 ENCOUNTER — Encounter (HOSPITAL_COMMUNITY): Payer: Self-pay

## 2017-06-02 ENCOUNTER — Encounter (HOSPITAL_COMMUNITY)
Admission: RE | Admit: 2017-06-02 | Discharge: 2017-06-02 | Disposition: A | Discharge status: Home / Self Care | Payer: BLUE CROSS/BLUE SHIELD | Source: Ambulatory Visit | Attending: Surgery | Admitting: Surgery

## 2017-06-02 DIAGNOSIS — Z01812 Encounter for preprocedural laboratory examination: Secondary | ICD-10-CM | POA: Diagnosis not present

## 2017-06-02 DIAGNOSIS — K432 Incisional hernia without obstruction or gangrene: Secondary | ICD-10-CM | POA: Diagnosis not present

## 2017-06-02 HISTORY — DX: Other intervertebral disc degeneration, lumbosacral region without mention of lumbar back pain or lower extremity pain: M51.379

## 2017-06-02 HISTORY — DX: Other intervertebral disc degeneration, lumbosacral region: M51.37

## 2017-06-02 LAB — CBC
HCT: 38.1 % — ABNORMAL LOW (ref 39.0–52.0)
HEMOGLOBIN: 12.8 g/dL — AB (ref 13.0–17.0)
MCH: 28.5 pg (ref 26.0–34.0)
MCHC: 33.6 g/dL (ref 30.0–36.0)
MCV: 84.9 fL (ref 78.0–100.0)
Platelets: 281 10*3/uL (ref 150–400)
RBC: 4.49 MIL/uL (ref 4.22–5.81)
RDW: 14.1 % (ref 11.5–15.5)
WBC: 7.6 10*3/uL (ref 4.0–10.5)

## 2017-06-02 NOTE — Pre-Procedure Instructions (Signed)
CBC results 06/02/2017 faxed to Dr. Ezzard Standing

## 2017-06-08 NOTE — H&P (Signed)
[facility]" Sherrian DiversJ Williams  [facility] Patient #: [id number] DOB: Sep 13, 1955 Single / Language: [place] / Race: White Male  History of Present Illness   The patient is a 62 year old male who presents for an evaluation of a hernia.  Goes by "Peter Williams"  His PCP is Peter Williams.  His GI doctor is Dr. Sharia Reeve. Williams. He comes with his girlfriend, Peter Williams.  I saw Peter Williams in 08/08/2014 for an incisional hernia, so it has been almost 3 years since I saw him. The hernia was not bothering him much at that time and we decided to observe it. Over the last several months, however, the hernia has become larger. It is becoming somewhat uncomfortable. And he has a little more trouble reducing the hernia.  He is followed by Peter Williams for severe reflux. I can see an upper endoscopy to Peter Williams did on 20 August 2014. He had some bile gastritis, but otherwise the anatomy of a Bilroth II.  He has no known liver, pancreas, or colon disease.  Plan: 1) Laparoscopic repair of ventral incisional hernia.  I discussed the indications and complications of hernia surgery with the patient. I discussed both the laparoscopic and open approach to hernia repair.. The potential risks of hernia surgery include, but are not limited to, bleeding, infection, open surgery, nerve injury, and recurrence of the hernia.  I provided the patient literature about hernia surgery.  History of hernia (2016): Patient has noticed an increasing hernia in his upper abdomen over the last 2 years. It is a little larger than when he first noticed it and causes some tenderness. He occasionally has nausea with the hernia. The patient has a history of 3 operations on his stomach for ulcer disease. The first operation in 1980. The second operation 1981. The third operation by Dr. Elpidio AnisLeroy Williams was a "bile reflux surgery" where he thinks he lost about a third to half of his stomach.   He is having continued gastroesophageal reflux disease. He sees Peter Williams for this. He is scheduled for an upper endoscopy on 20 August 2014 by Peter Williams.  Past Medical history: 1. GERD 2. History of PUD 3. History of Bilroth II procedure - 1980, 1981, and 1982 - not sure of all the operations 4. History of back surgery 1995 and 1998 - [place] 5. Quit smoking 2008 6. Last colonoscopy around 2008.  Social History: Not married. girlfriend, Peter ReusDonna Williams, is with him. 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   Allergies Peter Williams(Peter Williams, CMA; 04/28/2017 4:20 PM) No Known Drug Allergies [04/28/2017]: Allergies Reconciled   Medication History Peter Williams(Peter Williams, CMA; 04/28/2017 4:21 PM) Sucralfate (1GM Tablet, Oral) Active. Omeprazole (40MG  Capsule DR, Oral) Active. Metoclopramide HCl (10MG  Tablet, Oral) Active. Medications Reconciled  Vitals (Peter Williams CMA; 04/28/2017 4:21 PM) 04/28/2017 4:21 PM Weight: 205 lb Height: 72in Body Surface Area: 2.15 m Body Mass Index: 27.8 kg/m  Temp.: 97.47F(Oral)  Pulse: 97 (Regular)  BP: 150/84 (Sitting, Right Arm, Standard)   Physical Exam  General: WN WM alert and generally healthy appearing. HEENT: Normal. Pupils equal.  Neck: Supple. No mass. No thyroid mass.  Lymph Nodes: No supraclavicular or cervical nodes.  Lungs: Clear to auscultation and symmetric breath sounds. Heart: RRR. No murmur or rub.  Abdomen: Soft. No mass. Normal bowel sounds.   He has two upper abdominal incisions (one long midline and one short to the right of midline). Where these incisions are parrellel, there is a 3  to 4 cm abdominal wall hernia.  The hernia bulges to 5 x 6 cm. I am not sure that the defect is larger, but the bulge is larger and the hernia is harder to reduce.  Extremities: Good strength and ROM in upper and lower extremities. He has a tattoo on his left  arm.  Neurologic: Grossly intact to motor and sensory function. Psychiatric: Has normal mood and affect. Behavior is normal.  Assessment & Plan  1.  VENTRAL INCISIONAL HERNIA WITHOUT OBSTRUCTION OR GANGRENE (K43.2)  Plan:  1. Plan laparoscopic repair of ventral incisional hernia  2. GERD 3. History of PUD 4. History of Bilroth II procedure - 1980, 1981, and 1982 - not sure of all the operations   Peter Kin, MD, [facility] Pager: [phone] Office phone:  [phone]

## 2017-06-09 ENCOUNTER — Encounter (HOSPITAL_COMMUNITY): Payer: Self-pay | Admitting: *Deleted

## 2017-06-09 ENCOUNTER — Encounter (HOSPITAL_COMMUNITY)
Admission: RE | Disposition: A | Discharge status: Home / Self Care | Payer: Self-pay | Source: Ambulatory Visit | Attending: Surgery

## 2017-06-09 ENCOUNTER — Other Ambulatory Visit: Payer: Self-pay

## 2017-06-09 ENCOUNTER — Ambulatory Visit (HOSPITAL_COMMUNITY): Payer: BLUE CROSS/BLUE SHIELD | Admitting: Anesthesiology

## 2017-06-09 ENCOUNTER — Observation Stay (HOSPITAL_COMMUNITY)
Admission: RE | Admit: 2017-06-09 | Discharge: 2017-06-10 | Disposition: A | Discharge status: Home / Self Care | Payer: BLUE CROSS/BLUE SHIELD | Source: Ambulatory Visit | Attending: Surgery | Admitting: Surgery

## 2017-06-09 DIAGNOSIS — M1990 Unspecified osteoarthritis, unspecified site: Secondary | ICD-10-CM | POA: Diagnosis not present

## 2017-06-09 DIAGNOSIS — K439 Ventral hernia without obstruction or gangrene: Secondary | ICD-10-CM | POA: Diagnosis not present

## 2017-06-09 DIAGNOSIS — D649 Anemia, unspecified: Secondary | ICD-10-CM | POA: Insufficient documentation

## 2017-06-09 DIAGNOSIS — Z79899 Other long term (current) drug therapy: Secondary | ICD-10-CM | POA: Diagnosis not present

## 2017-06-09 DIAGNOSIS — Z8711 Personal history of peptic ulcer disease: Secondary | ICD-10-CM | POA: Diagnosis not present

## 2017-06-09 DIAGNOSIS — K219 Gastro-esophageal reflux disease without esophagitis: Secondary | ICD-10-CM | POA: Diagnosis not present

## 2017-06-09 DIAGNOSIS — Z87891 Personal history of nicotine dependence: Secondary | ICD-10-CM | POA: Insufficient documentation

## 2017-06-09 DIAGNOSIS — K430 Incisional hernia with obstruction, without gangrene: Principal | ICD-10-CM | POA: Insufficient documentation

## 2017-06-09 DIAGNOSIS — K436 Other and unspecified ventral hernia with obstruction, without gangrene: Secondary | ICD-10-CM | POA: Diagnosis present

## 2017-06-09 HISTORY — PX: INCISIONAL HERNIA REPAIR: SHX193

## 2017-06-09 HISTORY — PX: INSERTION OF MESH: SHX5868

## 2017-06-09 SURGERY — REPAIR, HERNIA, INCISIONAL, LAPAROSCOPIC
Anesthesia: General

## 2017-06-09 MED ORDER — SUGAMMADEX SODIUM 200 MG/2ML IV SOLN
INTRAVENOUS | Status: AC
  Filled 2017-06-09: qty 2

## 2017-06-09 MED ORDER — PANTOPRAZOLE SODIUM 40 MG PO TBEC
80.0000 mg | DELAYED_RELEASE_TABLET | Freq: Every day | ORAL | Status: DC
  Administered 2017-06-09 – 2017-06-10 (×2): 80 mg via ORAL
  Filled 2017-06-09 (×2): qty 2

## 2017-06-09 MED ORDER — CHLORHEXIDINE GLUCONATE CLOTH 2 % EX PADS: 6.0000 | MEDICATED_PAD | Freq: Once | CUTANEOUS | Status: DC

## 2017-06-09 MED ORDER — GABAPENTIN 300 MG PO CAPS
300.0000 mg | ORAL_CAPSULE | ORAL | Status: AC
  Administered 2017-06-09: 300 mg via ORAL
  Filled 2017-06-09: qty 1

## 2017-06-09 MED ORDER — KCL IN DEXTROSE-NACL 20-5-0.45 MEQ/L-%-% IV SOLN
INTRAVENOUS | Status: DC
  Administered 2017-06-09: 12:00:00 via INTRAVENOUS
  Administered 2017-06-10: 1000 mL via INTRAVENOUS
  Filled 2017-06-09 (×2): qty 1000

## 2017-06-09 MED ORDER — FENTANYL CITRATE (PF) 250 MCG/5ML IJ SOLN
INTRAMUSCULAR | Status: AC
Start: 2017-06-09 — End: ?
  Filled 2017-06-09: qty 5

## 2017-06-09 MED ORDER — LACTATED RINGERS IV SOLN
INTRAVENOUS | Status: DC | PRN
  Administered 2017-06-09 (×2): via INTRAVENOUS

## 2017-06-09 MED ORDER — PROPOFOL 10 MG/ML IV BOLUS
INTRAVENOUS | Status: DC | PRN
  Administered 2017-06-09: 180 mg via INTRAVENOUS

## 2017-06-09 MED ORDER — LIDOCAINE HCL 2 % IJ SOLN
INTRAMUSCULAR | Status: AC
  Filled 2017-06-09: qty 20

## 2017-06-09 MED ORDER — BUPIVACAINE-EPINEPHRINE (PF) 0.25% -1:200000 IJ SOLN
INTRAMUSCULAR | Status: AC
  Filled 2017-06-09: qty 30

## 2017-06-09 MED ORDER — ONDANSETRON 4 MG PO TBDP: 4.0000 mg | ORAL_TABLET | Freq: Four times a day (QID) | ORAL | Status: DC | PRN

## 2017-06-09 MED ORDER — LIDOCAINE 20MG/ML (2%) 15 ML SYRINGE OPTIME
INTRAMUSCULAR | Status: DC | PRN
  Administered 2017-06-09: 1.5 mg/kg/h via INTRAVENOUS

## 2017-06-09 MED ORDER — ONDANSETRON HCL 4 MG/2ML IJ SOLN
INTRAMUSCULAR | Status: DC | PRN
  Administered 2017-06-09: 4 mg via INTRAVENOUS

## 2017-06-09 MED ORDER — ONDANSETRON HCL 4 MG/2ML IJ SOLN
INTRAMUSCULAR | Status: AC
  Filled 2017-06-09: qty 2

## 2017-06-09 MED ORDER — MORPHINE SULFATE (PF) 2 MG/ML IV SOLN: 1.0000 mg | INTRAVENOUS | Status: DC | PRN

## 2017-06-09 MED ORDER — 0.9 % SODIUM CHLORIDE (POUR BTL) OPTIME
TOPICAL | Status: DC | PRN
  Administered 2017-06-09: 1000 mL

## 2017-06-09 MED ORDER — TRAMADOL HCL 50 MG PO TABS: 50.0000 mg | ORAL_TABLET | Freq: Four times a day (QID) | ORAL | Status: DC | PRN

## 2017-06-09 MED ORDER — PROPOFOL 10 MG/ML IV BOLUS
INTRAVENOUS | Status: AC
  Filled 2017-06-09: qty 20

## 2017-06-09 MED ORDER — ONDANSETRON HCL 4 MG/2ML IJ SOLN: 4.0000 mg | Freq: Four times a day (QID) | INTRAMUSCULAR | Status: DC | PRN

## 2017-06-09 MED ORDER — DEXAMETHASONE SODIUM PHOSPHATE 10 MG/ML IJ SOLN
INTRAMUSCULAR | Status: AC
  Filled 2017-06-09: qty 1

## 2017-06-09 MED ORDER — LIDOCAINE 2% (20 MG/ML) 5 ML SYRINGE
INTRAMUSCULAR | Status: AC
  Filled 2017-06-09: qty 5

## 2017-06-09 MED ORDER — DEXAMETHASONE SODIUM PHOSPHATE 10 MG/ML IJ SOLN
INTRAMUSCULAR | Status: DC | PRN
  Administered 2017-06-09: 10 mg via INTRAVENOUS

## 2017-06-09 MED ORDER — KETAMINE HCL 10 MG/ML IJ SOLN
INTRAMUSCULAR | Status: DC | PRN
  Administered 2017-06-09: 40 mg via INTRAVENOUS

## 2017-06-09 MED ORDER — KETAMINE HCL 10 MG/ML IJ SOLN
INTRAMUSCULAR | Status: AC
  Filled 2017-06-09: qty 1

## 2017-06-09 MED ORDER — ROCURONIUM BROMIDE 10 MG/ML (PF) SYRINGE
PREFILLED_SYRINGE | INTRAVENOUS | Status: DC | PRN
  Administered 2017-06-09: 10 mg via INTRAVENOUS

## 2017-06-09 MED ORDER — LIDOCAINE 2% (20 MG/ML) 5 ML SYRINGE
INTRAMUSCULAR | Status: DC | PRN
  Administered 2017-06-09: 100 mg via INTRAVENOUS

## 2017-06-09 MED ORDER — SUCCINYLCHOLINE CHLORIDE 200 MG/10ML IV SOSY
PREFILLED_SYRINGE | INTRAVENOUS | Status: DC | PRN
  Administered 2017-06-09: 50 mg via INTRAVENOUS

## 2017-06-09 MED ORDER — ROCURONIUM BROMIDE 10 MG/ML (PF) SYRINGE
PREFILLED_SYRINGE | INTRAVENOUS | Status: AC
  Filled 2017-06-09: qty 5

## 2017-06-09 MED ORDER — PROMETHAZINE HCL 25 MG/ML IJ SOLN: 6.2500 mg | INTRAMUSCULAR | Status: DC | PRN

## 2017-06-09 MED ORDER — ACETAMINOPHEN 500 MG PO TABS
1000.0000 mg | ORAL_TABLET | ORAL | Status: AC
  Administered 2017-06-09: 1000 mg via ORAL
  Filled 2017-06-09: qty 2

## 2017-06-09 MED ORDER — ROPIVACAINE HCL 7.5 MG/ML IJ SOLN: INTRAMUSCULAR | Status: DC | PRN

## 2017-06-09 MED ORDER — MIDAZOLAM HCL 2 MG/2ML IJ SOLN
INTRAMUSCULAR | Status: AC
  Filled 2017-06-09: qty 2

## 2017-06-09 MED ORDER — HEPARIN SODIUM (PORCINE) 5000 UNIT/ML IJ SOLN
5000.0000 [IU] | Freq: Three times a day (TID) | INTRAMUSCULAR | Status: DC
  Administered 2017-06-09 – 2017-06-10 (×2): 5000 [IU] via SUBCUTANEOUS
  Filled 2017-06-09 (×2): qty 1

## 2017-06-09 MED ORDER — HYDROCODONE-ACETAMINOPHEN 5-325 MG PO TABS
1.0000 | ORAL_TABLET | ORAL | Status: DC | PRN
  Administered 2017-06-09: 2 via ORAL
  Administered 2017-06-09 – 2017-06-10 (×3): 1 via ORAL
  Filled 2017-06-09 (×2): qty 1
  Filled 2017-06-09: qty 2
  Filled 2017-06-09: qty 1

## 2017-06-09 MED ORDER — FENTANYL CITRATE (PF) 100 MCG/2ML IJ SOLN
INTRAMUSCULAR | Status: DC | PRN
  Administered 2017-06-09 (×2): 50 ug via INTRAVENOUS
  Administered 2017-06-09: 100 ug via INTRAVENOUS
  Administered 2017-06-09: 50 ug via INTRAVENOUS

## 2017-06-09 MED ORDER — BUPIVACAINE-EPINEPHRINE (PF) 0.25% -1:200000 IJ SOLN
INTRAMUSCULAR | Status: DC | PRN
  Administered 2017-06-09: 30 mL

## 2017-06-09 MED ORDER — SUGAMMADEX SODIUM 200 MG/2ML IV SOLN
INTRAVENOUS | Status: DC | PRN
  Administered 2017-06-09: 200 mg via INTRAVENOUS

## 2017-06-09 MED ORDER — CEFAZOLIN SODIUM-DEXTROSE 2-4 GM/100ML-% IV SOLN
2.0000 g | INTRAVENOUS | Status: AC
  Administered 2017-06-09: 2 g via INTRAVENOUS
  Filled 2017-06-09: qty 100

## 2017-06-09 MED ORDER — MIDAZOLAM HCL 5 MG/5ML IJ SOLN
INTRAMUSCULAR | Status: DC | PRN
  Administered 2017-06-09: 2 mg via INTRAVENOUS

## 2017-06-09 MED ORDER — FENTANYL CITRATE (PF) 100 MCG/2ML IJ SOLN: 25.0000 ug | INTRAMUSCULAR | Status: DC | PRN

## 2017-06-09 MED ORDER — ACETAMINOPHEN 325 MG PO TABS
650.0000 mg | ORAL_TABLET | Freq: Three times a day (TID) | ORAL | Status: DC
  Administered 2017-06-09 – 2017-06-10 (×3): 650 mg via ORAL
  Filled 2017-06-09 (×3): qty 2

## 2017-06-09 SURGICAL SUPPLY — 35 items
BINDER ABDOMINAL 12 ML 46-62 (SOFTGOODS) ×3 IMPLANT
CABLE HIGH FREQUENCY MONO STRZ (ELECTRODE) ×3 IMPLANT
CLOSURE WOUND 1/2 X4 (GAUZE/BANDAGES/DRESSINGS)
COVER SURGICAL LIGHT HANDLE (MISCELLANEOUS) ×3 IMPLANT
DECANTER SPIKE VIAL GLASS SM (MISCELLANEOUS) ×3 IMPLANT
DERMABOND ADVANCED (GAUZE/BANDAGES/DRESSINGS) ×2
DERMABOND ADVANCED .7 DNX12 (GAUZE/BANDAGES/DRESSINGS) ×1 IMPLANT
DEVICE SECURE STRAP 25 ABSORB (INSTRUMENTS) ×6 IMPLANT
DEVICE TROCAR PUNCTURE CLOSURE (ENDOMECHANICALS) ×3 IMPLANT
DRAPE INCISE IOBAN 66X45 STRL (DRAPES) ×3 IMPLANT
DRSG PAD ABDOMINAL 8X10 ST (GAUZE/BANDAGES/DRESSINGS) ×6 IMPLANT
ELECT PENCIL ROCKER SW 15FT (MISCELLANEOUS) ×3 IMPLANT
ELECT REM PT RETURN 15FT ADLT (MISCELLANEOUS) ×3 IMPLANT
GLOVE BIOGEL PI IND STRL 7.0 (GLOVE) ×1 IMPLANT
GLOVE BIOGEL PI INDICATOR 7.0 (GLOVE) ×2
GLOVE SURG SIGNA 7.5 PF LTX (GLOVE) ×3 IMPLANT
GOWN STRL REUS W/ TWL XL LVL3 (GOWN DISPOSABLE) ×1 IMPLANT
GOWN STRL REUS W/TWL LRG LVL3 (GOWN DISPOSABLE) ×9 IMPLANT
GOWN STRL REUS W/TWL XL LVL3 (GOWN DISPOSABLE) ×2
KIT BASIN OR (CUSTOM PROCEDURE TRAY) ×3 IMPLANT
MARKER SKIN DUAL TIP RULER LAB (MISCELLANEOUS) ×3 IMPLANT
MESH PARIETEX 20X15 (Mesh General) ×3 IMPLANT
NEEDLE INSUFFLATION 14GA 150MM (NEEDLE) ×3 IMPLANT
NEEDLE SPNL 22GX3.5 QUINCKE BK (NEEDLE) ×3 IMPLANT
SCISSORS LAP 5X35 DISP (ENDOMECHANICALS) ×3 IMPLANT
SHEARS HARMONIC ACE PLUS 36CM (ENDOMECHANICALS) ×3 IMPLANT
SOLUTION ANTI FOG 6CC (MISCELLANEOUS) ×3 IMPLANT
STRIP CLOSURE SKIN 1/2X4 (GAUZE/BANDAGES/DRESSINGS) IMPLANT
SUT MNCRL AB 4-0 PS2 18 (SUTURE) ×3 IMPLANT
SUT NOVA 0 T19/GS 22DT (SUTURE) ×6 IMPLANT
TOWEL OR 17X26 10 PK STRL BLUE (TOWEL DISPOSABLE) ×3 IMPLANT
TRAY LAPAROSCOPIC (CUSTOM PROCEDURE TRAY) ×3 IMPLANT
TROCAR XCEL NON-BLD 11X100MML (ENDOMECHANICALS) ×6 IMPLANT
TROCAR XCEL UNIV SLVE 11M 100M (ENDOMECHANICALS) ×9 IMPLANT
TUBING INSUF HEATED (TUBING) ×3 IMPLANT

## 2017-06-09 NOTE — Transfer of Care (Signed)
Immediate Anesthesia Transfer of Care Note  Patient: Peter Williams  Procedure(s) Performed: LAPAROSCOPIC VENTRAL INCISIONAL HERNIA REPAIR WITH MESH (N/A ) INSERTION OF MESH (N/A )  Patient Location: PACU  Anesthesia Type:General  Level of Consciousness: awake, alert  and oriented  Airway & Oxygen Therapy: Patient Spontanous Breathing and Patient connected to face mask oxygen  Post-op Assessment: Report given to RN and Post -op Vital signs reviewed and stable  Post vital signs: Reviewed and stable  Last Vitals:  Vitals Value Taken Time  BP    Temp    Pulse 70 06/09/2017 10:56 AM  Resp    SpO2 100 % 06/09/2017 10:56 AM  Vitals shown include unvalidated device data.  Last Pain:  Vitals:   06/09/17 0711  TempSrc:   PainSc: 0-No pain         Complications: No apparent anesthesia complications

## 2017-06-09 NOTE — Plan of Care (Signed)
Plan of care discussed with patient and significant other

## 2017-06-09 NOTE — Anesthesia Postprocedure Evaluation (Signed)
Anesthesia Post Note  Patient: Peter Williams  Procedure(s) Performed: LAPAROSCOPIC VENTRAL INCISIONAL HERNIA REPAIR WITH MESH (N/A ) INSERTION OF MESH (N/A )     Patient location during evaluation: PACU Anesthesia Type: General Level of consciousness: awake and alert Pain management: pain level controlled Vital Signs Assessment: post-procedure vital signs reviewed and stable Respiratory status: spontaneous breathing, nonlabored ventilation and respiratory function stable Cardiovascular status: blood pressure returned to baseline and stable Postop Assessment: no apparent nausea or vomiting Anesthetic complications: no    Last Vitals:  Vitals:   06/09/17 1130 06/09/17 1150  BP: 129/69 136/73  Pulse: (!) 56 64  Resp: 14 15  Temp: 36.4 C 36.5 C  SpO2: 98% 100%    Last Pain:  Vitals:   06/09/17 1150  TempSrc:   PainSc: 3                  Cecile HearingStephen Edward Turk

## 2017-06-09 NOTE — Anesthesia Preprocedure Evaluation (Addendum)
Anesthesia Evaluation  Patient identified by MRN, date of birth, ID band Patient awake    Reviewed: Allergy & Precautions, NPO status , Patient's Chart, lab work & pertinent test results  Airway Mallampati: II  TM Distance: <3 FB Neck ROM: Full    Dental  (+) Dental Advisory Given, Edentulous Upper   Pulmonary former smoker,    Pulmonary exam normal breath sounds clear to auscultation       Cardiovascular Exercise Tolerance: Good negative cardio ROS Normal cardiovascular exam Rhythm:Regular Rate:Normal     Neuro/Psych negative neurological ROS  negative psych ROS   GI/Hepatic Neg liver ROS, PUD, GERD  Medicated and Controlled,VENTRAL INCISIONAL HERNIA   Endo/Other  negative endocrine ROS  Renal/GU negative Renal ROS     Musculoskeletal  (+) Arthritis ,   Abdominal   Peds  Hematology  (+) Blood dyscrasia, anemia ,   Anesthesia Other Findings Day of surgery medications reviewed with the patient.  Reproductive/Obstetrics                            Anesthesia Physical Anesthesia Plan  ASA: II  Anesthesia Plan: General   Post-op Pain Management:    Induction: Intravenous  PONV Risk Score and Plan: 3 and Midazolam, Dexamethasone and Ondansetron  Airway Management Planned: Oral ETT  Additional Equipment:   Intra-op Plan:   Post-operative Plan: Extubation in OR  Informed Consent: I have reviewed the patients History and Physical, chart, labs and discussed the procedure including the risks, benefits and alternatives for the proposed anesthesia with the patient or authorized representative who has indicated his/her understanding and acceptance.   Dental advisory given  Plan Discussed with: CRNA  Anesthesia Plan Comments:         Anesthesia Quick Evaluation

## 2017-06-09 NOTE — Interval H&P Note (Signed)
History and Physical Interval Note:  06/09/2017 9:01 AM  Peter Williams  has presented today for surgery, with the diagnosis of VENTRAL INCISIONAL HERNIA  The various methods of treatment have been discussed with the patient and family.  Wife at bedside.   After consideration of risks, benefits and other options for treatment, the patient has consented to  Procedure(s): LAPAROSCOPIC VENTRAL INCISIONAL HERNIA REPAIR WITH MESH (N/A) INSERTION OF MESH (N/A) as a surgical intervention .  The patient's history has been reviewed, patient examined, no change in status, stable for surgery.  I have reviewed the patient's chart and labs.  Questions were answered to the patient's satisfaction.     Peter Williams

## 2017-06-09 NOTE — Op Note (Addendum)
OPERATIVE NOTE  06/09/2017  10:47 AM  PATIENT:  Peter Williams, 62 y.o., male, MRN: [id number]  PREOP DIAGNOSIS:  VENTRAL INCISIONAL HERNIA  POSTOP DIAGNOSIS:   Ventral incisional hernia, incarcerated omentum  PROCEDURE:   Procedure(s):  LAPAROSCOPIC VENTRAL INCISIONAL HERNIA REPAIR WITH MESH Enterolysis of adhesions for 20 minutes [pictures at the end of the note]  SURGEON:   Ovidio Kinavid Dicky Boer, M.D.  ASSISTANT:   None  ANESTHESIA:   general  Anesthesiologist: Cecile Hearingurk, Stephen Edward, MD CRNA: Atilano MedianWilliford, Peggy D, CRNA  General  EBL:  minimal  ml  BLOOD ADMINISTERED: none  DRAINS: none   LOCAL MEDICATIONS USED:   30 cc of 1/4% marcaine  SPECIMEN:   None  COUNTS CORRECT:  YES  INDICATIONS FOR PROCEDURE:  Peter JuniperMarvin J Williams is a 62 y.o. (DOB: 1955/10/09) white male whose primary care physician is Assunta FoundGolding, John, MD and comes for laparoscopic repair of ventral incisional hernia.   The indications and risks of the surgery were explained to the patient.  The risks include, but are not limited to, infection, bleeding, and nerve injury.  OPERATIVE NOTE:  The patient was taken to [facility] at [facility].  He underwent a general anesthesia.  He was given 2 grams of Ancef at the beginning of the operation.   A time out was held and the surgical checklist run.   The abdomen was prepped with Cholroprep and sterilely draped.  I covered the abdomen with a Ioban drape.   I accessed the LUQ with a 5 mm trocar.  An additional 10 mm trocar was placed in the left lower abdomen and a 5 mm right abdominal trocar.   The patient has adhesions of omentum and colon to the anterior abdominal wall.  These adhesions were taken down.  The patient has a "swiss cheese" defect of the old right paramedian incision.   This defect involved about 6 cm x 12 cm of the anterior abdominal wall. Two of the defects had significant omentum in small hernia defects that I had to reduce.  I spent 20 minutes lysing adhesions and reducing  omentum from the hernias.   Then I placed a 15 x 20 cm Parietex mesh in the abdomen.  It had 8 holding sutures of 0 Novafil.  These were tied down to secure the mesh to the anterior abdominal wall.  I then used 43 Securestrap tacks to tack the mesh to the anterior abdominal wall.  I placed two additional through and through 0 Novafil sutures through the Parietex mesh.   The abdomen was deflated.  The mesh was inspected and there were no defects around the edge.   The trocars were then removed.  There was no bleeding at the trocar sites.  The incisions were closed with 4-0 Monocryl and painted with DermaBond. The puncture sites were painted with DermaBond.   The sponge and needle count were correct.  The patient had an abdominal binder placed.  The patient was transferred to the recovery room in good condition.    Type of repair - Mesh  (choices - primary suture, mesh, or component)  Name of mesh - Parietex  Size of mesh - Length 20 cm, Width 15 cm  Mesh overlap - 4 cm  Placement of mesh - beneath fascia and into peritoneal cavity  (choices - beneath fascia and into peritoneal cavity, beneath fascia but external to peritoneal cavity, between the muscle and fascia, above or external to fascia)   LUQ - omentum and transverse colon  stuck to the anterior abdominal wall (vies from left lower abdomen to head) RUQ - "swiss cheese" defects LLQ - mesh in place  Ovidio Kin, MD, [facility] Surgery Pager: [phone] Office phone:  [phone]

## 2017-06-09 NOTE — Anesthesia Procedure Notes (Signed)
Procedure Name: Intubation Date/Time: 06/09/2017 9:16 AM Performed by: Boss Danielsen D, CRNA Pre-anesthesia Checklist: Patient identified, Emergency Drugs available, Suction available and Patient being monitored Patient Re-evaluated:Patient Re-evaluated prior to induction Oxygen Delivery Method: Circle system utilized Preoxygenation: Pre-oxygenation with 100% oxygen Induction Type: IV induction Ventilation: Mask ventilation without difficulty Laryngoscope Size: Mac and 4 Grade View: Grade I Tube type: Oral Tube size: 7.5 mm Number of attempts: 1 Airway Equipment and Method: Stylet Placement Confirmation: ETT inserted through vocal cords under direct vision,  positive ETCO2 and breath sounds checked- equal and bilateral Secured at: 22 cm Tube secured with: Tape Dental Injury: Teeth and Oropharynx as per pre-operative assessment

## 2017-06-09 NOTE — Anesthesia Procedure Notes (Deleted)
Anesthesia Regional Block: Adductor canal block   Pre-Anesthetic Checklist: ,, timeout performed, Correct Patient, Correct Site, Correct Laterality, Correct Procedure, Correct Position, site marked, Risks and benefits discussed,  Surgical consent,  Pre-op evaluation,  At surgeon's request and post-op pain management  Laterality: Left and Right  Prep: chloraprep       Needles:  Injection technique: Single-shot  Needle Type: Echogenic Needle     Needle Length: 9cm  Needle Gauge: 21     Additional Needles:   Procedures:,,,, ultrasound used (permanent image in chart),,,,  Narrative:  Start time: 06/09/2017 8:26 AM End time: 06/09/2017 8:36 AM Injection made incrementally with aspirations every 5 mL.  Performed by: Personally  Anesthesiologist: Cecile Hearingurk, Khyson Sebesta Edward, MD  Additional Notes: No pain on injection. No increased resistance to injection. Injection made in 5cc increments.  Good needle visualization.  Patient tolerated procedure well.  BILATERAL ADDUCTOR CANAL BLOCKS. 10cc 0.75% Ropivacaine injected into each side for a total of 20cc.

## 2017-06-10 ENCOUNTER — Encounter (HOSPITAL_COMMUNITY): Payer: Self-pay | Admitting: Surgery

## 2017-06-10 DIAGNOSIS — Z8711 Personal history of peptic ulcer disease: Secondary | ICD-10-CM | POA: Diagnosis not present

## 2017-06-10 DIAGNOSIS — Z79899 Other long term (current) drug therapy: Secondary | ICD-10-CM | POA: Diagnosis not present

## 2017-06-10 DIAGNOSIS — M1990 Unspecified osteoarthritis, unspecified site: Secondary | ICD-10-CM | POA: Diagnosis not present

## 2017-06-10 DIAGNOSIS — Z87891 Personal history of nicotine dependence: Secondary | ICD-10-CM | POA: Diagnosis not present

## 2017-06-10 DIAGNOSIS — K219 Gastro-esophageal reflux disease without esophagitis: Secondary | ICD-10-CM | POA: Diagnosis not present

## 2017-06-10 DIAGNOSIS — D649 Anemia, unspecified: Secondary | ICD-10-CM | POA: Diagnosis not present

## 2017-06-10 DIAGNOSIS — K430 Incisional hernia with obstruction, without gangrene: Secondary | ICD-10-CM | POA: Diagnosis not present

## 2017-06-10 MED ORDER — HYDROCODONE-ACETAMINOPHEN 5-325 MG PO TABS: 1.0000 | ORAL_TABLET | Freq: Four times a day (QID) | ORAL | 0 refills | Status: DC | PRN

## 2017-06-10 NOTE — Progress Notes (Signed)
Pt alert and oriented, tolerating diet.  D/C instructions and prescription was given, all questions answered.

## 2017-06-10 NOTE — Discharge Summary (Signed)
Physician Discharge Summary  Patient ID:  Peter Williams  MRN: [id number]  DOB/AGE: [birth year] 62 y.o.  Admit date: 06/09/2017 Discharge date: 06/10/2017  Discharge Diagnoses:  1.  VENTRAL INCISIONAL HERNIA WITHOUT OBSTRUCTION OR GANGRENE, with incarcerated omentum 2. GERD 3. History of PUD 4. History of Bilroth II procedures    Active Problems:   Incarcerated ventral hernia  Operation: Procedure(s):  LAPAROSCOPIC VENTRAL INCISIONAL HERNIA REPAIR WITH MESH, enterolysis of adhesions on 06/09/2017 [facility]  Discharged Condition: good  Hospital Course: Peter Williams is an 62 y.o. male whose primary care physician is Assunta FoundGolding, John, MD and who was admitted 06/09/2017 with a chief complaint of No chief complaint on file. Marland Kitchen.   He was brought to the operating room on 06/09/2017 and underwent LAPAROSCOPIC VENTRAL INCISIONAL HERNIA REPAIR WITH MESH, enterolysis of adhesions.  Because of the enterolysis and the incarceration, he was kept overnight.  He has pain in the RUQ at one of the incision sites. He has tolerated a diet and is ready to go home.  His girlfriend, Ferne ReusDonna Yotes, is with him.  The discharge instructions were reviewed with the patient.  Consults: None  Significant Diagnostic Studies: Results for orders placed or performed during the hospital encounter of 06/02/17  CBC  Result Value Ref Range   WBC 7.6 4.0 - 10.5 K/uL   RBC 4.49 4.22 - 5.81 MIL/uL   Hemoglobin 12.8 (L) 13.0 - 17.0 g/dL   HCT 40.938.1 (L) 81.139.0 - 91.452.0 %   MCV 84.9 78.0 - 100.0 fL   MCH 28.5 26.0 - 34.0 pg   MCHC 33.6 30.0 - 36.0 g/dL   RDW 78.214.1 95.611.5 - 21.315.5 %   Platelets 281 150 - 400 K/uL    No results found.  Discharge Exam:  Vitals:   06/10/17 0159 06/10/17 0604  BP: 112/64 137/74  Pulse: 73 81  Resp: 18 18  Temp: 98.4 F (36.9 C) 98.5 F (36.9 C)  SpO2: 94% 95%    General: WN WM who is alert and generally healthy appearing.  Lungs: Clear to auscultation and symmetric breath sounds. Heart:   RRR. No murmur or rub. Abdomen: Soft. No mass. No hernia. Few bowel sounds.  Tender RUQ at incision  Discharge Medications:   Allergies as of 06/10/2017   No Known Allergies     Medication List    TAKE these medications   ESTER C PO Take 1 tablet by mouth daily.   HYDROcodone-acetaminophen 5-325 MG tablet Commonly known as:  NORCO/VICODIN Take 1-2 tablets by mouth every 6 (six) hours as needed for moderate pain.   metoCLOPramide 10 MG tablet Commonly known as:  REGLAN TAKE 1 TABLET BY MOUTH AT BEDTIME   omeprazole 40 MG capsule Commonly known as:  PRILOSEC Take 1 capsule (40 mg total) by mouth daily. Take before breakfast. What changed:    when to take this  additional instructions   sucralfate 1 g tablet Commonly known as:  CARAFATE Take 1 tablet (1 g total) by mouth 4 (four) times daily -  with meals and at bedtime.       Disposition: Discharge disposition: 01-Home or Self Care       Discharge Instructions    Diet - low sodium heart healthy   Complete by:  As directed    Increase activity slowly   Complete by:  As directed       Signed: Ovidio Kinavid Kierstin January, M.D., [facility]  [facility]gery Office:  [phone]  06/10/2017, 7:22 AM

## 2017-06-10 NOTE — Discharge Instructions (Signed)
[facility] - DISCHARGE INSTRUCTIONS TO PATIENT  Return to work on:  July 8  Activity:  Driving - May drive in 2 or 3 days, if doing well and off pain meds   Lifting - No lifting more than 15 pounds for 4 weeks.  Wound Care:   May remove bandage and shower tomorrow.        Wear abdominal binder for one month  Diet:  As tolerated  Follow up appointment:  Call Dr. Allene PyoNewman's office [facility] at [phone] for an appointment in 2 weeks  Medications and dosages:  Resume your home medications.  You have a prescription for:  vicodin  Call Dr. Ezzard StandingNewman or his office  [phone]) if you have:  Temperature greater than 100.4,  Persistent nausea and vomiting,  Severe uncontrolled pain,  Redness, tenderness, or signs of infection (pain, swelling, redness, odor or green/yellow discharge around the site),  Difficulty breathing, headache or visual disturbances,  Any other questions or concerns you may have after discharge.  In an emergency, call 911 or go to an Emergency Department at a nearby hospital.

## 2017-06-12 ENCOUNTER — Other Ambulatory Visit: Payer: Self-pay | Admitting: Internal Medicine

## 2017-07-10 ENCOUNTER — Other Ambulatory Visit: Payer: Self-pay | Admitting: Internal Medicine

## 2017-07-25 DIAGNOSIS — K274 Chronic or unspecified peptic ulcer, site unspecified, with hemorrhage: Secondary | ICD-10-CM | POA: Diagnosis not present

## 2017-07-25 DIAGNOSIS — E782 Mixed hyperlipidemia: Secondary | ICD-10-CM | POA: Diagnosis not present

## 2017-07-25 DIAGNOSIS — E538 Deficiency of other specified B group vitamins: Secondary | ICD-10-CM | POA: Diagnosis not present

## 2017-07-25 DIAGNOSIS — Z6829 Body mass index (BMI) 29.0-29.9, adult: Secondary | ICD-10-CM | POA: Diagnosis not present

## 2017-07-25 DIAGNOSIS — R7309 Other abnormal glucose: Secondary | ICD-10-CM | POA: Diagnosis not present

## 2017-07-25 DIAGNOSIS — Z0001 Encounter for general adult medical examination with abnormal findings: Secondary | ICD-10-CM | POA: Diagnosis not present

## 2017-07-25 DIAGNOSIS — K432 Incisional hernia without obstruction or gangrene: Secondary | ICD-10-CM | POA: Diagnosis not present

## 2017-07-25 DIAGNOSIS — Z1389 Encounter for screening for other disorder: Secondary | ICD-10-CM | POA: Diagnosis not present

## 2017-07-25 DIAGNOSIS — E663 Overweight: Secondary | ICD-10-CM | POA: Diagnosis not present

## 2017-10-17 ENCOUNTER — Other Ambulatory Visit: Payer: Self-pay | Admitting: Internal Medicine

## 2017-10-23 ENCOUNTER — Other Ambulatory Visit: Payer: Self-pay | Admitting: Internal Medicine

## 2017-10-24 NOTE — Telephone Encounter (Signed)
Seen in January, may I refill Sir?

## 2017-10-27 NOTE — Telephone Encounter (Signed)
May refill but needs to see me  Call him about this and set up appt then can refill x 2 mos

## 2017-10-27 NOTE — Telephone Encounter (Signed)
Left him a detailed message to call me and set up appointment and then we'll be able to refill his reglan.

## 2017-10-28 NOTE — Telephone Encounter (Signed)
I called and left him another message to call me back.

## 2017-12-09 ENCOUNTER — Encounter: Payer: Self-pay | Admitting: Internal Medicine

## 2017-12-09 ENCOUNTER — Ambulatory Visit: Payer: BLUE CROSS/BLUE SHIELD | Admitting: Internal Medicine

## 2017-12-09 VITALS — BP 140/70 | HR 80 | Ht 70.0 in | Wt 208.5 lb

## 2017-12-09 DIAGNOSIS — K219 Gastro-esophageal reflux disease without esophagitis: Secondary | ICD-10-CM | POA: Diagnosis not present

## 2017-12-09 NOTE — Progress Notes (Signed)
   Peter Williams 62 y.o. Sep 22, 1955 [id number]  Assessment & Plan:   Encounter Diagnosis  Name Primary?  . Gastroesophageal reflux disease, esophagitis presence not specified Yes   He is improved with the new treatment with Dexilant and I would be happy to refill that in the future.  Return to see me in 2 years routinely sooner if needed.  I appreciate the opportunity to care for this patient. CC: Assunta FoundGolding, John, MD   Subjective:   Chief Complaint:  HPI Peter Williams is here for follow-up, I had seen him in January he had reflux problems and a ventral hernia.  At that time I emphasized conservative measures particularly at nighttime, including Gaviscon and raise the head of the bed.  I had asked him to contact me after that but I do not think he did.  He ended up seeing Dr. Ezzard StandingNewman for his ventral hernia and had that successfully repaired this summer.  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  He does take some Carafate at bedtime but otherwise he is doing very well without symptoms of his reflux.  He is asking me to be available to refill that in the future. No Known Allergies Current Meds  Medication Sig  . Bioflavonoid Products (ESTER C PO) Take 1 tablet by mouth daily.  . celecoxib (CELEBREX) 200 MG capsule Take 200 mg by mouth daily.  Marland Kitchen. DEXILANT 60 MG capsule Take 1 capsule by mouth daily.  . sucralfate (CARAFATE) 1 g tablet TAKE 1 TABLET BY MOUTH 4 TIMES DAILY WITH MEALS AND AT BEDTIME   Past Medical History:  Diagnosis Date  . Bruit of right carotid artery    patient unaware  . DDD (degenerative disc disease), lumbosacral   . Gastritis   . GERD (gastroesophageal reflux disease)   . Peptic ulcer disease    Past Surgical History:  Procedure Laterality Date  . BACK SURGERY     S5 and L3 vertabrae repair  . BILROTH II PROCEDURE  1981   bilroth II, revised with bile-reflux bypass  .  COLONOSCOPY    . ESOPHAGOGASTRODUODENOSCOPY    . INCISIONAL HERNIA REPAIR N/A 06/09/2017   Procedure: LAPAROSCOPIC VENTRAL INCISIONAL HERNIA REPAIR WITH MESH;  Surgeon: Ovidio KinNewman, David, MD;  Location: [facility];  Service: General;  Laterality: N/A;  . INSERTION OF MESH N/A 06/09/2017   Procedure: INSERTION OF MESH;  Surgeon: Ovidio KinNewman, David, MD;  Location: [facility];  Service: General;  Laterality: N/A;   Social History   Social History Narrative   Divorced   1 daughter   Visual merchandiserHVAC tech at [employer]   2 [employer] daily   07/13/2014   family history includes Alzheimer's disease in his mother; Diabetes in his brother.   Review of Systems As above  Objective:   Physical Exam BP 140/70 (BP Location: Left Arm, Patient Position: Sitting, Cuff Size: Normal)   Pulse 80   Ht 5\' 10"  (1.778 m) Comment: height measured without shoes  Wt 208 lb 8 oz (94.6 kg)   BMI 29.92 kg/m  No acute distress

## 2017-12-09 NOTE — Patient Instructions (Addendum)
   I am glad you are feeling well.  I am happy to refill your Dexilant when needed.  Please call us when the time comes and we will do so. The other way is to have the pharmacy contact us - but if you do not tell them to contact me it will probably go back to the wellness nurse practitioner.  I appreciate the opportunity to care for you. Iva Booparl E. Gessner, MD, [facility]

## 2018-01-23 ENCOUNTER — Telehealth: Payer: Self-pay | Admitting: Internal Medicine

## 2018-01-23 MED ORDER — DEXILANT 60 MG PO CPDR: 1.0000 | DELAYED_RELEASE_CAPSULE | Freq: Every day | ORAL | 11 refills | Status: DC

## 2018-01-23 NOTE — Telephone Encounter (Signed)
Pt called and requested a prescription for Dexilant as discussed in previous OV with Dr Leone PayorGessner.  Pt also asked for [facility]

## 2018-01-23 NOTE — Telephone Encounter (Signed)
According to the last office note we can refill the Dexilant. I have faxed the rx along with a coupon card to the [facility] pharmacy as requested.

## 2018-04-12 ENCOUNTER — Telehealth: Payer: Self-pay

## 2018-04-12 NOTE — Telephone Encounter (Signed)
Incoming fax request for prior auth on Dexilant 60 mg 1 a day. Submitted with cover my meds.

## 2018-04-13 NOTE — Telephone Encounter (Signed)
Approved. 04-12-2018 to 04-10-2021

## 2018-08-08 DIAGNOSIS — E538 Deficiency of other specified B group vitamins: Secondary | ICD-10-CM | POA: Diagnosis not present

## 2018-08-08 DIAGNOSIS — Z0001 Encounter for general adult medical examination with abnormal findings: Secondary | ICD-10-CM | POA: Diagnosis not present

## 2018-08-08 DIAGNOSIS — Z1389 Encounter for screening for other disorder: Secondary | ICD-10-CM | POA: Diagnosis not present

## 2018-08-08 DIAGNOSIS — K219 Gastro-esophageal reflux disease without esophagitis: Secondary | ICD-10-CM | POA: Diagnosis not present

## 2018-08-08 DIAGNOSIS — E7849 Other hyperlipidemia: Secondary | ICD-10-CM | POA: Diagnosis not present

## 2018-08-08 DIAGNOSIS — R7309 Other abnormal glucose: Secondary | ICD-10-CM | POA: Diagnosis not present

## 2018-08-08 DIAGNOSIS — K432 Incisional hernia without obstruction or gangrene: Secondary | ICD-10-CM | POA: Diagnosis not present

## 2018-08-08 DIAGNOSIS — K274 Chronic or unspecified peptic ulcer, site unspecified, with hemorrhage: Secondary | ICD-10-CM | POA: Diagnosis not present

## 2018-08-08 DIAGNOSIS — Z6829 Body mass index (BMI) 29.0-29.9, adult: Secondary | ICD-10-CM | POA: Diagnosis not present

## 2018-09-12 DIAGNOSIS — L03031 Cellulitis of right toe: Secondary | ICD-10-CM | POA: Diagnosis not present

## 2018-09-12 DIAGNOSIS — M79674 Pain in right toe(s): Secondary | ICD-10-CM | POA: Diagnosis not present

## 2018-09-26 DIAGNOSIS — L03031 Cellulitis of right toe: Secondary | ICD-10-CM | POA: Diagnosis not present

## 2018-09-26 DIAGNOSIS — B351 Tinea unguium: Secondary | ICD-10-CM | POA: Diagnosis not present

## 2018-10-20 ENCOUNTER — Encounter: Payer: Self-pay | Admitting: Internal Medicine

## 2018-10-26 ENCOUNTER — Encounter: Payer: Self-pay | Admitting: Internal Medicine

## 2018-11-20 ENCOUNTER — Ambulatory Visit (AMBULATORY_SURGERY_CENTER): Payer: Self-pay

## 2018-11-20 ENCOUNTER — Other Ambulatory Visit: Payer: Self-pay

## 2018-11-20 VITALS — Temp 96.9°F | Ht 70.0 in | Wt 207.6 lb

## 2018-11-20 DIAGNOSIS — Z1211 Encounter for screening for malignant neoplasm of colon: Secondary | ICD-10-CM

## 2018-11-20 NOTE — Progress Notes (Signed)
Denies allergies to eggs or soy products. Denies complication of anesthesia or sedation. Denies use of weight loss medication. Denies use of O2.   Emmi instructions given for colonoscopy.  Covid screening is scheduled for 11/28/18 @ 10:00 Am.

## 2018-11-21 ENCOUNTER — Encounter: Payer: Self-pay | Admitting: Internal Medicine

## 2018-11-28 ENCOUNTER — Other Ambulatory Visit: Payer: Self-pay | Admitting: Internal Medicine

## 2018-11-28 ENCOUNTER — Ambulatory Visit: Payer: BC Managed Care – PPO

## 2018-11-28 DIAGNOSIS — Z1159 Encounter for screening for other viral diseases: Secondary | ICD-10-CM

## 2018-11-29 ENCOUNTER — Telehealth: Payer: Self-pay | Admitting: Internal Medicine

## 2018-11-29 ENCOUNTER — Telehealth: Payer: Self-pay | Admitting: Gastroenterology

## 2018-11-29 LAB — SARS CORONAVIRUS 2 (TAT 6-24 HRS): SARS Coronavirus 2: POSITIVE — AB

## 2018-11-29 NOTE — Telephone Encounter (Signed)
Patient notified that he will not need to be retested.  He is asked to quarantine for 10 days and reach out to his PCP if he develops any symptoms

## 2018-11-29 NOTE — Telephone Encounter (Signed)
Noted. Thanks.

## 2018-11-29 NOTE — Telephone Encounter (Signed)
This patient was found to be positive for COVID-19 on routine pre-procedure testing for [place]. I called him with the results this morning. He was surprised as he has no symptoms and no identified contacts.   . This test result was explained to him. . They are aware that their upcoming [place] endoscopic procedure will be cancelled. . They were advised to isolate themselves for at least 10 days from the date of this positive test. . They were instructed to contact their PCP for further COVID-19 related recommendations. . This communication will be sent to the patient's primary [place] [facility]ologist and to their PCP.  Please reschedule the procedure per protocol. Thanks.

## 2018-12-04 ENCOUNTER — Encounter: Payer: BLUE CROSS/BLUE SHIELD | Admitting: Internal Medicine

## 2018-12-27 ENCOUNTER — Encounter: Payer: BC Managed Care – PPO | Admitting: Internal Medicine

## 2019-01-03 ENCOUNTER — Encounter: Payer: BC Managed Care – PPO | Admitting: Internal Medicine

## 2019-01-15 ENCOUNTER — Ambulatory Visit (AMBULATORY_SURGERY_CENTER): Payer: BC Managed Care – PPO | Admitting: Internal Medicine

## 2019-01-15 ENCOUNTER — Other Ambulatory Visit: Payer: Self-pay

## 2019-01-15 ENCOUNTER — Encounter: Payer: Self-pay | Admitting: Internal Medicine

## 2019-01-15 VITALS — BP 111/72 | HR 69 | Temp 98.0°F | Resp 15 | Ht 70.0 in | Wt 207.6 lb

## 2019-01-15 DIAGNOSIS — Z1211 Encounter for screening for malignant neoplasm of colon: Secondary | ICD-10-CM

## 2019-01-15 MED ORDER — SODIUM CHLORIDE 0.9 % IV SOLN: 500.0000 mL | Freq: Once | INTRAVENOUS | Status: DC

## 2019-01-15 NOTE — Patient Instructions (Addendum)
No polyps or cancer seen.  You do have diverticulosis - thickened muscle rings and pouches in the colon wall. Please read the handout about this condition.  Next routine colonoscopy or other screening test in 10 years - 2031  I appreciate the opportunity to care for you. Iva Boop, MD, [facility]   Handouts given to you on Diverticulosis today.    YOU HAD AN ENDOSCOPIC PROCEDURE TODAY AT THE [facility]:   Refer to the procedure report that was given to you for any specific questions about what was found during the examination.  If the procedure report does not answer your questions, please call your gastroenterologist to clarify.  If you requested that your care partner not be given the details of your procedure findings, then the procedure report has been included in a sealed envelope for you to review at your convenience later.  YOU SHOULD EXPECT: Some feelings of bloating in the abdomen. Passage of more gas than usual.  Walking can help get rid of the air that was put into your GI tract during the procedure and reduce the bloating. If you had a lower endoscopy (such as a colonoscopy or flexible sigmoidoscopy) you may notice spotting of blood in your stool or on the toilet paper. If you underwent a bowel prep for your procedure, you may not have a normal bowel movement for a few days.  Please Note:  You might notice some irritation and congestion in your nose or some drainage.  This is from the oxygen used during your procedure.  There is no need for concern and it should clear up in a day or so.  SYMPTOMS TO REPORT IMMEDIATELY:   Following lower endoscopy (colonoscopy or flexible sigmoidoscopy):  Excessive amounts of blood in the stool  Significant tenderness or worsening of abdominal pains  Swelling of the abdomen that is new, acute  Fever of 100F or higher   For urgent or emergent issues, a gastroenterologist can be reached at any hour by calling [phone]  [phone].   DIET:  We do recommend a small meal at first, but then you may proceed to your regular diet.  Drink plenty of fluids but you should avoid alcoholic beverages for 24 hours.  ACTIVITY:  You should plan to take it easy for the rest of today and you should NOT DRIVE or use heavy machinery until tomorrow (because of the sedation medicines used during the test).    FOLLOW UP: Our staff will call the number listed on your records 48-72 hours following your procedure to check on you and address any questions or concerns that you may have regarding the information given to you following your procedure. If we do not reach you, we will leave a message.  We will attempt to reach you two times.  During this call, we will ask if you have developed any symptoms of COVID 19. If you develop any symptoms (ie: fever, flu-like symptoms, shortness of breath, cough etc.) before then, please call [phone].  If you test positive for Covid 19 in the 2 weeks post procedure, please call and report this information to [place].    If any biopsies were taken you will be contacted by phone or by letter within the next 1-3 weeks.  Please call us at [phone] if you have not heard about the biopsies in 3 weeks.    SIGNATURES/CONFIDENTIALITY: You and/or your care partner have signed paperwork which will be entered into your electronic medical record.  These signatures attest to the fact that that the information above on your After Visit Summary has been reviewed and is understood.  Full responsibility of the confidentiality of this discharge information lies with you and/or your care-partner.

## 2019-01-15 NOTE — Op Note (Signed)
[facility] Patient Name: Peter Williams Procedure Date: 01/15/2019 3:21 PM MRN: [id number] Endoscopist: Gatha Mayer , MD Age: 64 Referring MD:  Date of Birth: 02-Jun-1955 Gender: Male Account #: [id number] Procedure:                Colonoscopy Indications:              Screening for colorectal malignant neoplasm Medicines:                Propofol per Anesthesia, Monitored Anesthesia Care Procedure:                Pre-Anesthesia Assessment:                           - Prior to the procedure, a History and Physical                            was performed, and patient medications and                            allergies were reviewed. The patient's tolerance of                            previous anesthesia was also reviewed. The risks                            and benefits of the procedure and the sedation                            options and risks were discussed with the patient.                            All questions were answered, and informed consent                            was obtained. Prior Anticoagulants: The patient has                            taken no previous anticoagulant or antiplatelet                            agents. ASA Grade Assessment: II - A patient with                            mild systemic disease. After reviewing the risks                            and benefits, the patient was deemed in                            satisfactory condition to undergo the procedure.                           After obtaining informed consent, the colonoscope  was passed under direct vision. Throughout the                            procedure, the patient's blood pressure, pulse, and                            oxygen saturations were monitored continuously. The                            Colonoscope was introduced through the anus and                            advanced to the the cecum, identified by   appendiceal orifice and ileocecal valve. The                            ileocecal valve, appendiceal orifice, and rectum                            were photographed. The quality of the bowel                            preparation was good. The bowel preparation used                            was Miralax via split dose instruction. Scope In: 3:37:58 PM Scope Out: 3:47:31 PM Scope Withdrawal Time: 0 hours 7 minutes 36 seconds  Total Procedure Duration: 0 hours 9 minutes 33 seconds  Findings:                 The perianal and digital rectal examinations were                            normal. Pertinent negatives include normal prostate                            (size, shape, and consistency).                           Multiple diverticula were found in the sigmoid                            colon. There was narrowing of the colon in                            association with the diverticular opening.                           The exam was otherwise without abnormality on                            direct and retroflexion views. Complications:            No immediate complications. Estimated blood loss:  None. Estimated Blood Loss:     Estimated blood loss: none. Recommendation:           - Repeat colonoscopy in 10 years for screening                            purposes.                           - Resume previous diet.                           - Continue present medications. Iva Boop, MD 01/15/2019 3:53:01 PM This report has been signed electronically.

## 2019-01-15 NOTE — Progress Notes (Signed)
PT taken to PACU. Monitors in place. VSS. Report given to RN. 

## 2019-01-15 NOTE — Progress Notes (Signed)
Temp JR  VS DT  Pt's states no medical or surgical changes since previsit or office visit. 

## 2019-01-17 ENCOUNTER — Telehealth: Payer: Self-pay

## 2019-01-17 ENCOUNTER — Telehealth: Payer: Self-pay | Admitting: *Deleted

## 2019-01-17 NOTE — Telephone Encounter (Signed)
  Follow up Call-  Call back number 01/15/2019  Post procedure Call Back phone  # [phone]  Permission to leave phone message Yes  Some recent data might be hidden     Patient questions:  Do you have a fever, pain , or abdominal swelling? No. Pain Score  0 *  Have you tolerated food without any problems? Yes.    Have you been able to return to your normal activities? Yes.    Do you have any questions about your discharge instructions: Diet   No. Medications  No. Follow up visit  No.  Do you have questions or concerns about your Care? No.  Actions: * If pain score is 4 or above: No action needed, pain <4.  1. Have you developed a fever since your procedure? no  2.   Have you had an respiratory symptoms (SOB or cough) since your procedure? no  3.   Have you tested positive for COVID 19 since your procedure no  4.   Have you had any family members/close contacts diagnosed with the COVID 19 since your procedure?  no   If yes to any of these questions please route to Laverna Peace, RN and Jennye Boroughs, Charity fundraiser.

## 2019-01-17 NOTE — Telephone Encounter (Signed)
Follow call made, left message. 

## 2019-04-13 DIAGNOSIS — M25561 Pain in right knee: Secondary | ICD-10-CM | POA: Diagnosis not present

## 2019-04-13 DIAGNOSIS — Z6831 Body mass index (BMI) 31.0-31.9, adult: Secondary | ICD-10-CM | POA: Diagnosis not present

## 2019-04-13 DIAGNOSIS — M1991 Primary osteoarthritis, unspecified site: Secondary | ICD-10-CM | POA: Diagnosis not present

## 2019-04-13 DIAGNOSIS — E6609 Other obesity due to excess calories: Secondary | ICD-10-CM | POA: Diagnosis not present

## 2019-04-13 DIAGNOSIS — Z1389 Encounter for screening for other disorder: Secondary | ICD-10-CM | POA: Diagnosis not present

## 2019-05-07 DIAGNOSIS — M170 Bilateral primary osteoarthritis of knee: Secondary | ICD-10-CM | POA: Diagnosis not present

## 2019-05-20 ENCOUNTER — Other Ambulatory Visit: Payer: Self-pay | Admitting: Internal Medicine

## 2019-05-22 ENCOUNTER — Telehealth: Payer: Self-pay | Admitting: Internal Medicine

## 2019-05-22 NOTE — Telephone Encounter (Signed)
Pt called to inform that his pharmacy told him that they need a new coupon for Dexilant as the one given to pt expired. Pt is requesting if we can call his pharmacy to provide new coupon information.

## 2019-05-23 NOTE — Telephone Encounter (Signed)
[name] informed that coupon code has been called to the pharmacy.

## 2019-06-12 DIAGNOSIS — M170 Bilateral primary osteoarthritis of knee: Secondary | ICD-10-CM | POA: Diagnosis not present

## 2019-06-15 DIAGNOSIS — M25562 Pain in left knee: Secondary | ICD-10-CM | POA: Diagnosis not present

## 2019-06-19 DIAGNOSIS — M170 Bilateral primary osteoarthritis of knee: Secondary | ICD-10-CM | POA: Diagnosis not present

## 2019-07-05 DIAGNOSIS — M9903 Segmental and somatic dysfunction of lumbar region: Secondary | ICD-10-CM | POA: Diagnosis not present

## 2019-07-05 DIAGNOSIS — M2392 Unspecified internal derangement of left knee: Secondary | ICD-10-CM | POA: Diagnosis not present

## 2019-07-05 DIAGNOSIS — M62838 Other muscle spasm: Secondary | ICD-10-CM | POA: Diagnosis not present

## 2019-07-05 DIAGNOSIS — M170 Bilateral primary osteoarthritis of knee: Secondary | ICD-10-CM | POA: Diagnosis not present

## 2019-07-05 DIAGNOSIS — M545 Low back pain: Secondary | ICD-10-CM | POA: Diagnosis not present

## 2019-07-05 DIAGNOSIS — M2391 Unspecified internal derangement of right knee: Secondary | ICD-10-CM | POA: Diagnosis not present

## 2019-07-05 DIAGNOSIS — M9905 Segmental and somatic dysfunction of pelvic region: Secondary | ICD-10-CM | POA: Diagnosis not present

## 2019-07-05 DIAGNOSIS — M9906 Segmental and somatic dysfunction of lower extremity: Secondary | ICD-10-CM | POA: Diagnosis not present

## 2019-08-21 ENCOUNTER — Telehealth: Payer: Self-pay | Admitting: Internal Medicine

## 2019-08-21 DIAGNOSIS — M62838 Other muscle spasm: Secondary | ICD-10-CM | POA: Diagnosis not present

## 2019-08-21 DIAGNOSIS — M9905 Segmental and somatic dysfunction of pelvic region: Secondary | ICD-10-CM | POA: Diagnosis not present

## 2019-08-21 DIAGNOSIS — M4316 Spondylolisthesis, lumbar region: Secondary | ICD-10-CM | POA: Diagnosis not present

## 2019-08-21 DIAGNOSIS — R600 Localized edema: Secondary | ICD-10-CM | POA: Diagnosis not present

## 2019-08-21 DIAGNOSIS — M9903 Segmental and somatic dysfunction of lumbar region: Secondary | ICD-10-CM | POA: Diagnosis not present

## 2019-08-21 DIAGNOSIS — M9906 Segmental and somatic dysfunction of lower extremity: Secondary | ICD-10-CM | POA: Diagnosis not present

## 2019-08-21 DIAGNOSIS — M5136 Other intervertebral disc degeneration, lumbar region: Secondary | ICD-10-CM | POA: Diagnosis not present

## 2019-08-21 DIAGNOSIS — M545 Low back pain: Secondary | ICD-10-CM | POA: Diagnosis not present

## 2019-08-21 NOTE — Telephone Encounter (Signed)
I left a detailed message on his voice mail that I am mailing him several coupons.  A Dexilant one, A goodrx one, and a single care one.

## 2019-08-22 DIAGNOSIS — M4316 Spondylolisthesis, lumbar region: Secondary | ICD-10-CM | POA: Diagnosis not present

## 2019-08-22 DIAGNOSIS — M545 Low back pain: Secondary | ICD-10-CM | POA: Diagnosis not present

## 2019-08-22 DIAGNOSIS — R600 Localized edema: Secondary | ICD-10-CM | POA: Diagnosis not present

## 2019-08-22 DIAGNOSIS — M9903 Segmental and somatic dysfunction of lumbar region: Secondary | ICD-10-CM | POA: Diagnosis not present

## 2019-08-22 DIAGNOSIS — M9905 Segmental and somatic dysfunction of pelvic region: Secondary | ICD-10-CM | POA: Diagnosis not present

## 2019-08-22 DIAGNOSIS — M5136 Other intervertebral disc degeneration, lumbar region: Secondary | ICD-10-CM | POA: Diagnosis not present

## 2019-08-22 DIAGNOSIS — M62838 Other muscle spasm: Secondary | ICD-10-CM | POA: Diagnosis not present

## 2019-08-22 DIAGNOSIS — M9906 Segmental and somatic dysfunction of lower extremity: Secondary | ICD-10-CM | POA: Diagnosis not present

## 2019-08-23 DIAGNOSIS — M9905 Segmental and somatic dysfunction of pelvic region: Secondary | ICD-10-CM | POA: Diagnosis not present

## 2019-08-23 DIAGNOSIS — M62838 Other muscle spasm: Secondary | ICD-10-CM | POA: Diagnosis not present

## 2019-08-23 DIAGNOSIS — M4316 Spondylolisthesis, lumbar region: Secondary | ICD-10-CM | POA: Diagnosis not present

## 2019-08-23 DIAGNOSIS — M9906 Segmental and somatic dysfunction of lower extremity: Secondary | ICD-10-CM | POA: Diagnosis not present

## 2019-08-23 DIAGNOSIS — M9903 Segmental and somatic dysfunction of lumbar region: Secondary | ICD-10-CM | POA: Diagnosis not present

## 2019-08-23 DIAGNOSIS — R600 Localized edema: Secondary | ICD-10-CM | POA: Diagnosis not present

## 2019-08-23 DIAGNOSIS — M5136 Other intervertebral disc degeneration, lumbar region: Secondary | ICD-10-CM | POA: Diagnosis not present

## 2019-08-23 DIAGNOSIS — M545 Low back pain: Secondary | ICD-10-CM | POA: Diagnosis not present

## 2019-08-28 DIAGNOSIS — M9903 Segmental and somatic dysfunction of lumbar region: Secondary | ICD-10-CM | POA: Diagnosis not present

## 2019-08-28 DIAGNOSIS — M9906 Segmental and somatic dysfunction of lower extremity: Secondary | ICD-10-CM | POA: Diagnosis not present

## 2019-08-28 DIAGNOSIS — M4316 Spondylolisthesis, lumbar region: Secondary | ICD-10-CM | POA: Diagnosis not present

## 2019-08-28 DIAGNOSIS — R600 Localized edema: Secondary | ICD-10-CM | POA: Diagnosis not present

## 2019-08-28 DIAGNOSIS — M62838 Other muscle spasm: Secondary | ICD-10-CM | POA: Diagnosis not present

## 2019-08-28 DIAGNOSIS — M5136 Other intervertebral disc degeneration, lumbar region: Secondary | ICD-10-CM | POA: Diagnosis not present

## 2019-08-28 DIAGNOSIS — M9905 Segmental and somatic dysfunction of pelvic region: Secondary | ICD-10-CM | POA: Diagnosis not present

## 2019-08-28 DIAGNOSIS — M545 Low back pain: Secondary | ICD-10-CM | POA: Diagnosis not present

## 2019-08-29 DIAGNOSIS — M9903 Segmental and somatic dysfunction of lumbar region: Secondary | ICD-10-CM | POA: Diagnosis not present

## 2019-08-29 DIAGNOSIS — M4316 Spondylolisthesis, lumbar region: Secondary | ICD-10-CM | POA: Diagnosis not present

## 2019-08-29 DIAGNOSIS — M5136 Other intervertebral disc degeneration, lumbar region: Secondary | ICD-10-CM | POA: Diagnosis not present

## 2019-08-29 DIAGNOSIS — M62838 Other muscle spasm: Secondary | ICD-10-CM | POA: Diagnosis not present

## 2019-08-29 DIAGNOSIS — M9906 Segmental and somatic dysfunction of lower extremity: Secondary | ICD-10-CM | POA: Diagnosis not present

## 2019-08-29 DIAGNOSIS — M545 Low back pain: Secondary | ICD-10-CM | POA: Diagnosis not present

## 2019-08-29 DIAGNOSIS — R600 Localized edema: Secondary | ICD-10-CM | POA: Diagnosis not present

## 2019-08-29 DIAGNOSIS — M9905 Segmental and somatic dysfunction of pelvic region: Secondary | ICD-10-CM | POA: Diagnosis not present

## 2019-09-05 DIAGNOSIS — M5136 Other intervertebral disc degeneration, lumbar region: Secondary | ICD-10-CM | POA: Diagnosis not present

## 2019-09-05 DIAGNOSIS — M9905 Segmental and somatic dysfunction of pelvic region: Secondary | ICD-10-CM | POA: Diagnosis not present

## 2019-09-05 DIAGNOSIS — M9906 Segmental and somatic dysfunction of lower extremity: Secondary | ICD-10-CM | POA: Diagnosis not present

## 2019-09-05 DIAGNOSIS — M62838 Other muscle spasm: Secondary | ICD-10-CM | POA: Diagnosis not present

## 2019-09-05 DIAGNOSIS — M4316 Spondylolisthesis, lumbar region: Secondary | ICD-10-CM | POA: Diagnosis not present

## 2019-09-05 DIAGNOSIS — M9903 Segmental and somatic dysfunction of lumbar region: Secondary | ICD-10-CM | POA: Diagnosis not present

## 2019-09-05 DIAGNOSIS — M545 Low back pain: Secondary | ICD-10-CM | POA: Diagnosis not present

## 2019-09-06 DIAGNOSIS — M9903 Segmental and somatic dysfunction of lumbar region: Secondary | ICD-10-CM | POA: Diagnosis not present

## 2019-09-06 DIAGNOSIS — M4316 Spondylolisthesis, lumbar region: Secondary | ICD-10-CM | POA: Diagnosis not present

## 2019-09-06 DIAGNOSIS — M9906 Segmental and somatic dysfunction of lower extremity: Secondary | ICD-10-CM | POA: Diagnosis not present

## 2019-09-06 DIAGNOSIS — M5136 Other intervertebral disc degeneration, lumbar region: Secondary | ICD-10-CM | POA: Diagnosis not present

## 2019-09-06 DIAGNOSIS — M25461 Effusion, right knee: Secondary | ICD-10-CM | POA: Diagnosis not present

## 2019-09-06 DIAGNOSIS — R600 Localized edema: Secondary | ICD-10-CM | POA: Diagnosis not present

## 2019-09-06 DIAGNOSIS — M545 Low back pain: Secondary | ICD-10-CM | POA: Diagnosis not present

## 2019-09-06 DIAGNOSIS — M9905 Segmental and somatic dysfunction of pelvic region: Secondary | ICD-10-CM | POA: Diagnosis not present

## 2019-09-06 DIAGNOSIS — M62838 Other muscle spasm: Secondary | ICD-10-CM | POA: Diagnosis not present

## 2019-09-11 DIAGNOSIS — M9903 Segmental and somatic dysfunction of lumbar region: Secondary | ICD-10-CM | POA: Diagnosis not present

## 2019-09-11 DIAGNOSIS — M5136 Other intervertebral disc degeneration, lumbar region: Secondary | ICD-10-CM | POA: Diagnosis not present

## 2019-09-11 DIAGNOSIS — M4316 Spondylolisthesis, lumbar region: Secondary | ICD-10-CM | POA: Diagnosis not present

## 2019-09-11 DIAGNOSIS — R600 Localized edema: Secondary | ICD-10-CM | POA: Diagnosis not present

## 2019-09-11 DIAGNOSIS — M9906 Segmental and somatic dysfunction of lower extremity: Secondary | ICD-10-CM | POA: Diagnosis not present

## 2019-09-11 DIAGNOSIS — M545 Low back pain: Secondary | ICD-10-CM | POA: Diagnosis not present

## 2019-09-11 DIAGNOSIS — M62838 Other muscle spasm: Secondary | ICD-10-CM | POA: Diagnosis not present

## 2019-09-11 DIAGNOSIS — M9905 Segmental and somatic dysfunction of pelvic region: Secondary | ICD-10-CM | POA: Diagnosis not present

## 2019-09-13 DIAGNOSIS — M5136 Other intervertebral disc degeneration, lumbar region: Secondary | ICD-10-CM | POA: Diagnosis not present

## 2019-09-13 DIAGNOSIS — M9906 Segmental and somatic dysfunction of lower extremity: Secondary | ICD-10-CM | POA: Diagnosis not present

## 2019-09-13 DIAGNOSIS — M9903 Segmental and somatic dysfunction of lumbar region: Secondary | ICD-10-CM | POA: Diagnosis not present

## 2019-09-13 DIAGNOSIS — M9905 Segmental and somatic dysfunction of pelvic region: Secondary | ICD-10-CM | POA: Diagnosis not present

## 2019-09-13 DIAGNOSIS — R600 Localized edema: Secondary | ICD-10-CM | POA: Diagnosis not present

## 2019-09-13 DIAGNOSIS — M4316 Spondylolisthesis, lumbar region: Secondary | ICD-10-CM | POA: Diagnosis not present

## 2019-09-13 DIAGNOSIS — M62838 Other muscle spasm: Secondary | ICD-10-CM | POA: Diagnosis not present

## 2019-09-13 DIAGNOSIS — M545 Low back pain: Secondary | ICD-10-CM | POA: Diagnosis not present

## 2019-09-17 DIAGNOSIS — M9905 Segmental and somatic dysfunction of pelvic region: Secondary | ICD-10-CM | POA: Diagnosis not present

## 2019-09-17 DIAGNOSIS — M545 Low back pain: Secondary | ICD-10-CM | POA: Diagnosis not present

## 2019-09-17 DIAGNOSIS — M4316 Spondylolisthesis, lumbar region: Secondary | ICD-10-CM | POA: Diagnosis not present

## 2019-09-17 DIAGNOSIS — M9903 Segmental and somatic dysfunction of lumbar region: Secondary | ICD-10-CM | POA: Diagnosis not present

## 2019-09-17 DIAGNOSIS — M5136 Other intervertebral disc degeneration, lumbar region: Secondary | ICD-10-CM | POA: Diagnosis not present

## 2019-09-17 DIAGNOSIS — M62838 Other muscle spasm: Secondary | ICD-10-CM | POA: Diagnosis not present

## 2019-09-17 DIAGNOSIS — R600 Localized edema: Secondary | ICD-10-CM | POA: Diagnosis not present

## 2019-09-17 DIAGNOSIS — M9906 Segmental and somatic dysfunction of lower extremity: Secondary | ICD-10-CM | POA: Diagnosis not present

## 2019-09-20 DIAGNOSIS — M4316 Spondylolisthesis, lumbar region: Secondary | ICD-10-CM | POA: Diagnosis not present

## 2019-09-20 DIAGNOSIS — M9903 Segmental and somatic dysfunction of lumbar region: Secondary | ICD-10-CM | POA: Diagnosis not present

## 2019-09-20 DIAGNOSIS — M5136 Other intervertebral disc degeneration, lumbar region: Secondary | ICD-10-CM | POA: Diagnosis not present

## 2019-09-20 DIAGNOSIS — M62838 Other muscle spasm: Secondary | ICD-10-CM | POA: Diagnosis not present

## 2019-09-20 DIAGNOSIS — R600 Localized edema: Secondary | ICD-10-CM | POA: Diagnosis not present

## 2019-09-20 DIAGNOSIS — M9906 Segmental and somatic dysfunction of lower extremity: Secondary | ICD-10-CM | POA: Diagnosis not present

## 2019-09-20 DIAGNOSIS — M545 Low back pain: Secondary | ICD-10-CM | POA: Diagnosis not present

## 2019-09-20 DIAGNOSIS — M9905 Segmental and somatic dysfunction of pelvic region: Secondary | ICD-10-CM | POA: Diagnosis not present

## 2019-10-22 DIAGNOSIS — M25561 Pain in right knee: Secondary | ICD-10-CM | POA: Diagnosis not present

## 2019-11-08 DIAGNOSIS — M79676 Pain in unspecified toe(s): Secondary | ICD-10-CM | POA: Diagnosis not present

## 2019-11-08 DIAGNOSIS — B351 Tinea unguium: Secondary | ICD-10-CM | POA: Diagnosis not present

## 2019-11-08 DIAGNOSIS — M25561 Pain in right knee: Secondary | ICD-10-CM | POA: Diagnosis not present

## 2019-11-14 DIAGNOSIS — M1711 Unilateral primary osteoarthritis, right knee: Secondary | ICD-10-CM | POA: Diagnosis not present

## 2019-12-04 DIAGNOSIS — M1991 Primary osteoarthritis, unspecified site: Secondary | ICD-10-CM | POA: Diagnosis not present

## 2019-12-04 DIAGNOSIS — K274 Chronic or unspecified peptic ulcer, site unspecified, with hemorrhage: Secondary | ICD-10-CM | POA: Diagnosis not present

## 2019-12-04 DIAGNOSIS — Z0000 Encounter for general adult medical examination without abnormal findings: Secondary | ICD-10-CM | POA: Diagnosis not present

## 2019-12-04 DIAGNOSIS — Z23 Encounter for immunization: Secondary | ICD-10-CM | POA: Diagnosis not present

## 2019-12-04 DIAGNOSIS — E7849 Other hyperlipidemia: Secondary | ICD-10-CM | POA: Diagnosis not present

## 2019-12-04 DIAGNOSIS — Z6829 Body mass index (BMI) 29.0-29.9, adult: Secondary | ICD-10-CM | POA: Diagnosis not present

## 2020-01-10 DIAGNOSIS — Z01818 Encounter for other preprocedural examination: Secondary | ICD-10-CM | POA: Diagnosis not present

## 2020-01-22 DIAGNOSIS — M1611 Unilateral primary osteoarthritis, right hip: Secondary | ICD-10-CM | POA: Diagnosis not present

## 2020-01-22 DIAGNOSIS — G8918 Other acute postprocedural pain: Secondary | ICD-10-CM | POA: Diagnosis not present

## 2020-01-22 DIAGNOSIS — M1711 Unilateral primary osteoarthritis, right knee: Secondary | ICD-10-CM | POA: Diagnosis not present

## 2020-02-06 DIAGNOSIS — Z471 Aftercare following joint replacement surgery: Secondary | ICD-10-CM | POA: Diagnosis not present

## 2020-02-06 DIAGNOSIS — Z96651 Presence of right artificial knee joint: Secondary | ICD-10-CM | POA: Diagnosis not present

## 2020-06-04 DIAGNOSIS — Z4789 Encounter for other orthopedic aftercare: Secondary | ICD-10-CM | POA: Diagnosis not present

## 2020-12-04 DIAGNOSIS — R059 Cough, unspecified: Secondary | ICD-10-CM | POA: Diagnosis not present

## 2020-12-04 DIAGNOSIS — R52 Pain, unspecified: Secondary | ICD-10-CM | POA: Diagnosis not present

## 2020-12-04 DIAGNOSIS — J101 Influenza due to other identified influenza virus with other respiratory manifestations: Secondary | ICD-10-CM | POA: Diagnosis not present

## 2020-12-04 DIAGNOSIS — R509 Fever, unspecified: Secondary | ICD-10-CM | POA: Diagnosis not present

## 2020-12-18 DIAGNOSIS — R7309 Other abnormal glucose: Secondary | ICD-10-CM | POA: Diagnosis not present

## 2020-12-18 DIAGNOSIS — K432 Incisional hernia without obstruction or gangrene: Secondary | ICD-10-CM | POA: Diagnosis not present

## 2020-12-18 DIAGNOSIS — Z1331 Encounter for screening for depression: Secondary | ICD-10-CM | POA: Diagnosis not present

## 2020-12-18 DIAGNOSIS — Z96651 Presence of right artificial knee joint: Secondary | ICD-10-CM | POA: Diagnosis not present

## 2020-12-18 DIAGNOSIS — E6609 Other obesity due to excess calories: Secondary | ICD-10-CM | POA: Diagnosis not present

## 2020-12-18 DIAGNOSIS — Z0000 Encounter for general adult medical examination without abnormal findings: Secondary | ICD-10-CM | POA: Diagnosis not present

## 2020-12-18 DIAGNOSIS — K219 Gastro-esophageal reflux disease without esophagitis: Secondary | ICD-10-CM | POA: Diagnosis not present

## 2020-12-18 DIAGNOSIS — Z6829 Body mass index (BMI) 29.0-29.9, adult: Secondary | ICD-10-CM | POA: Diagnosis not present

## 2020-12-18 DIAGNOSIS — E538 Deficiency of other specified B group vitamins: Secondary | ICD-10-CM | POA: Diagnosis not present

## 2020-12-18 DIAGNOSIS — E7849 Other hyperlipidemia: Secondary | ICD-10-CM | POA: Diagnosis not present

## 2020-12-18 DIAGNOSIS — E782 Mixed hyperlipidemia: Secondary | ICD-10-CM | POA: Diagnosis not present

## 2021-02-16 DIAGNOSIS — Z4789 Encounter for other orthopedic aftercare: Secondary | ICD-10-CM | POA: Diagnosis not present

## 2021-06-09 ENCOUNTER — Ambulatory Visit: Payer: BC Managed Care – PPO | Admitting: Gastroenterology

## 2021-06-29 ENCOUNTER — Ambulatory Visit: Payer: BC Managed Care – PPO | Admitting: Physician Assistant

## 2021-06-29 ENCOUNTER — Encounter: Payer: Self-pay | Admitting: Physician Assistant

## 2021-06-29 VITALS — BP 140/70 | HR 87 | Ht 70.0 in | Wt 209.0 lb

## 2021-06-29 DIAGNOSIS — K625 Hemorrhage of anus and rectum: Secondary | ICD-10-CM

## 2021-06-29 DIAGNOSIS — K602 Anal fissure, unspecified: Secondary | ICD-10-CM | POA: Diagnosis not present

## 2021-06-29 MED ORDER — AMBULATORY NON FORMULARY MEDICATION: 1 refills | Status: DC

## 2021-06-29 NOTE — Progress Notes (Signed)
Subjective:    Patient ID: Peter Williams, male    DOB: [birth year], 66 y.o.   MRN: [id number]  HPI  Demarion is a pleasant 66 year old white male, established with Dr. Leone Payor.  He has history of GERD, remote Billroth II for perforated ulcer in the 1980s, history of ventral hernia status postrepair with mesh.  He last had colonoscopy in January 2021 with finding of multiple sigmoid diverticuli with some narrowing associated with the diverticular openings, no internal or external hemorrhoids noted.  And no polyps. Today he comes in with complaints of rectal burning, discomfort some itching and intermittent bleeding.  He says his symptoms have been present over the past couple of months intermittently and feels that he is having "flares" with the symptoms.  He has noticed blood on the tissue currently but has not seen any blood mixed in with the bowel movement or in the commode.  He is having intermittent pain with defecation.  Stools have been normal, no problems with constipation and says he generally does not have any issues with straining etc.  No complaints of abdominal pain.  He has tried some Preparation H cream without any improvement in symptoms.  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    Review of Systems Pertinent positive and negative review of systems were noted in the above HPI section.  All other review of systems was otherwise negative.   Outpatient Encounter Medications as of 06/29/2021  Medication Sig   AMBULATORY NON FORMULARY MEDICATION Medication Name: Diltiazem 2%/ Lidocaine 5% Using your index finger, apply a small amount of medication inside the rectum up to your first knuckle/joint three times daily x 6-8 weeks.   DEXILANT 60 MG capsule Take 1 capsule by mouth once daily   Bioflavonoid Products (ESTER C PO) Take 1 tablet by mouth daily.   [DISCONTINUED] celecoxib (CELEBREX) 200 MG capsule Take 200 mg by mouth daily.   No  facility-administered encounter medications on file as of 06/29/2021.   No Known Allergies Patient Active Problem List   Diagnosis Date Noted   Incarcerated ventral hernia 06/09/2017   Ventral incisional hernia 05/20/2015   GERD 09/04/2008   Social History   Socioeconomic History   Marital status: Married    Spouse name: Not on file   Number of children: 1   Years of education: Not on file   Highest education level: Not on file  Occupational History   Occupation: Visual merchandiser  Tobacco Use   Smoking status: Former    Packs/day: 3.00    Years: 8.00    Total pack years: 24.00    Types: Cigarettes    Quit date: 07/11/2006    Years since quitting: 14.9   Smokeless tobacco: Never  Vaping Use   Vaping Use: Never used  Substance and Sexual Activity   Alcohol use: No    Alcohol/week: 0.0 standard drinks of alcohol   Drug use: Never   Sexual activity: Not on file  Other Topics Concern   Not on file  Social History Narrative   Divorced   1 daughter   Visual merchandiser at [employer]   2 [employer] daily   07/13/2014   Social Determinants of Health   Financial Resource Strain: Not on file  Food Insecurity: Not on file  Transportation Needs: Not on file  Physical Activity: Not on file  Stress: Not on file  Social Connections: Not on file  Intimate  Partner Violence: Not on file    Mr. Tea family history includes Alzheimer's disease in his mother; Diabetes in his brother.      Objective:    Vitals:   06/29/21 1314  BP: 140/70  Pulse: 87    Physical Exam Well-developed well-nourished older WM  in no acute distress.  Height, Weight,209  BMI 29.9  HEENT; nontraumatic normocephalic, EOMI, PE R LA, sclera anicteric. Oropharynx;not examined Neck; supple, no JVD Cardiovascular; regular rate and rhythm with S1-S2, no murmur rub or gallop Pulmonary; Clear bilaterally Abdomen; soft, nontender, nondistended, no palpable mass or hepatosplenomegaly, bowel sounds are  active Rectal;no external lesions noted - on anoscopy there is an anal fissure on left at 1 o'clock position, small internal hemorrhoids, quite tender to exam  Skin; benign exam, no jaundice rash or appreciable lesions Extremities; no clubbing cyanosis or edema skin warm and dry Neuro/Psych; alert and oriented x4, grossly nonfocal mood and affect appropriate        Assessment & Plan:   #56 66 year old white male with subacute anal fissure noted on a anoscopy today.  Patient has been symptomatic with intermittent rectal pain, painful bowel movements, itching and intermittent bright red blood on the tissue over the past 2 months.  #2 diverticulosis #3.  Colon cancer screening-up-to-date with colonoscopy January 2021-no polyps #4 status post remote Billroth II 1980s for perforated ulcer #5 status post ventral hernia repair with mesh 2019 #6 GERD  Plan; we discussed the natural history and slow healing process of anal fissures. He is advised to drink plenty of water, use a stool softener as needed with goal to have very regular easy bowel movements Start lidocaine 5% compounded with diltiazem 2% apply 1 inch into the anus 3 times daily x6 to 8 weeks.  He is asked to call in 8 weeks for advice if he is having persistence of symptoms.  Vonzell Lindblad Oswald Hillock PA-C 06/29/2021   Cc: Assunta Found, MD

## 2021-11-30 ENCOUNTER — Ambulatory Visit: Payer: BC Managed Care – PPO | Admitting: Podiatry

## 2021-11-30 DIAGNOSIS — L989 Disorder of the skin and subcutaneous tissue, unspecified: Secondary | ICD-10-CM

## 2021-11-30 NOTE — Progress Notes (Signed)
   Chief Complaint  Patient presents with   Callouses    Patient is here for calloses on left foot that he states are very painful.    Subjective: 66 y.o. male presenting to the office today as a new patient for evaluation of symptomatic calluses to the left fourth digit as well as the plantar aspect of the left forefoot.  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  Gradual onset of these calluses.  He presents for further treatment and evaluation   Past Medical History:  Diagnosis Date   Bruit of right carotid artery    patient unaware   DDD (degenerative disc disease), lumbosacral    Gastritis    GERD (gastroesophageal reflux disease)    Peptic ulcer disease     Past Surgical History:  Procedure Laterality Date   BACK SURGERY     S5 and L3 vertabrae repair   BILROTH II PROCEDURE  1981   bilroth II, revised with bile-reflux bypass   COLONOSCOPY     ESOPHAGOGASTRODUODENOSCOPY     INCISIONAL HERNIA REPAIR N/A 06/09/2017   Procedure: LAPAROSCOPIC VENTRAL INCISIONAL HERNIA REPAIR WITH MESH;  Surgeon: Ovidio Kin, MD;  Location: [facility];  Service: General;  Laterality: N/A;   INSERTION OF MESH N/A 06/09/2017   Procedure: INSERTION OF MESH;  Surgeon: Ovidio Kin, MD;  Location: [facility];  Service: General;  Laterality: N/A;    No Known Allergies   Objective:  Physical Exam General: Alert and oriented x3 in no acute distress  Dermatology: Hyperkeratotic lesion(s) present on the fourth digit and plantar aspect of the fifth MTP left. Pain on palpation with a central nucleated core noted. Skin is warm, dry and supple bilateral lower extremities. Negative for open lesions or macerations.  Vascular: Palpable pedal pulses bilaterally. No edema or erythema noted. Capillary refill within normal limits.  Neurological: Epicritic and protective threshold grossly intact bilaterally.   Musculoskeletal Exam: Pain on palpation at the keratotic lesion(s) noted.  Range of motion within normal limits bilateral. Muscle strength 5/5 in all groups bilateral.  Assessment: 1.  Symptomatic callus; benign skin lesion left fourth digit and plantar fifth MTP   Plan of Care:  1. Patient evaluated 2. Excisional debridement of keratoic lesion(s) using a chisel blade was performed without incident.  3.  Advised against going barefoot.  Recommend good supportive shoes and sneakers that are wide fitting and do not constrict the toebox area 4.  Silicone toe spacers dispensed.  Wear daily  5.  Patient is to return to the clinic PRN.   Felecia Shelling, DPM [facility]  Dr. Felecia Shelling, DPM    [address] [place], [place] [address]                Office [phone]  Fax [phone]

## 2021-12-06 DIAGNOSIS — N201 Calculus of ureter: Secondary | ICD-10-CM | POA: Diagnosis not present

## 2021-12-06 DIAGNOSIS — K5900 Constipation, unspecified: Secondary | ICD-10-CM | POA: Diagnosis not present

## 2021-12-06 DIAGNOSIS — N132 Hydronephrosis with renal and ureteral calculous obstruction: Secondary | ICD-10-CM | POA: Diagnosis not present

## 2021-12-06 DIAGNOSIS — R110 Nausea: Secondary | ICD-10-CM | POA: Diagnosis not present

## 2021-12-06 DIAGNOSIS — I708 Atherosclerosis of other arteries: Secondary | ICD-10-CM | POA: Diagnosis not present

## 2021-12-06 DIAGNOSIS — R1031 Right lower quadrant pain: Secondary | ICD-10-CM | POA: Diagnosis not present

## 2021-12-06 DIAGNOSIS — R300 Dysuria: Secondary | ICD-10-CM | POA: Diagnosis not present

## 2021-12-06 DIAGNOSIS — K5790 Diverticulosis of intestine, part unspecified, without perforation or abscess without bleeding: Secondary | ICD-10-CM | POA: Diagnosis not present

## 2021-12-21 DIAGNOSIS — N201 Calculus of ureter: Secondary | ICD-10-CM | POA: Diagnosis not present

## 2021-12-21 DIAGNOSIS — N200 Calculus of kidney: Secondary | ICD-10-CM | POA: Diagnosis not present

## 2022-01-08 DIAGNOSIS — N201 Calculus of ureter: Secondary | ICD-10-CM | POA: Diagnosis not present

## 2022-02-05 DIAGNOSIS — Z96651 Presence of right artificial knee joint: Secondary | ICD-10-CM | POA: Diagnosis not present

## 2022-02-05 DIAGNOSIS — K219 Gastro-esophageal reflux disease without esophagitis: Secondary | ICD-10-CM | POA: Diagnosis not present

## 2022-02-05 DIAGNOSIS — E538 Deficiency of other specified B group vitamins: Secondary | ICD-10-CM | POA: Diagnosis not present

## 2022-02-05 DIAGNOSIS — E7849 Other hyperlipidemia: Secondary | ICD-10-CM | POA: Diagnosis not present

## 2022-02-05 DIAGNOSIS — R7309 Other abnormal glucose: Secondary | ICD-10-CM | POA: Diagnosis not present

## 2022-02-05 DIAGNOSIS — Z0000 Encounter for general adult medical examination without abnormal findings: Secondary | ICD-10-CM | POA: Diagnosis not present

## 2022-02-05 DIAGNOSIS — E782 Mixed hyperlipidemia: Secondary | ICD-10-CM | POA: Diagnosis not present

## 2022-02-05 DIAGNOSIS — K274 Chronic or unspecified peptic ulcer, site unspecified, with hemorrhage: Secondary | ICD-10-CM | POA: Diagnosis not present

## 2022-02-05 DIAGNOSIS — E119 Type 2 diabetes mellitus without complications: Secondary | ICD-10-CM | POA: Diagnosis not present

## 2022-02-12 ENCOUNTER — Other Ambulatory Visit: Payer: Self-pay

## 2022-02-12 ENCOUNTER — Emergency Department (HOSPITAL_COMMUNITY): Payer: Medicare Other

## 2022-02-12 ENCOUNTER — Inpatient Hospital Stay (HOSPITAL_COMMUNITY)
Admission: EM | Admit: 2022-02-12 | Discharge: 2022-02-16 | DRG: 322 | Disposition: A | Discharge status: Home / Self Care | Payer: Medicare Other | Attending: Internal Medicine | Admitting: Internal Medicine

## 2022-02-12 DIAGNOSIS — R739 Hyperglycemia, unspecified: Secondary | ICD-10-CM | POA: Diagnosis present

## 2022-02-12 DIAGNOSIS — E785 Hyperlipidemia, unspecified: Secondary | ICD-10-CM | POA: Diagnosis not present

## 2022-02-12 DIAGNOSIS — E7800 Pure hypercholesterolemia, unspecified: Secondary | ICD-10-CM | POA: Diagnosis present

## 2022-02-12 DIAGNOSIS — Z820 Family history of epilepsy and other diseases of the nervous system: Secondary | ICD-10-CM | POA: Diagnosis not present

## 2022-02-12 DIAGNOSIS — I2510 Atherosclerotic heart disease of native coronary artery without angina pectoris: Secondary | ICD-10-CM | POA: Diagnosis not present

## 2022-02-12 DIAGNOSIS — Z7982 Long term (current) use of aspirin: Secondary | ICD-10-CM

## 2022-02-12 DIAGNOSIS — I700 Atherosclerosis of aorta: Secondary | ICD-10-CM | POA: Diagnosis not present

## 2022-02-12 DIAGNOSIS — I214 Non-ST elevation (NSTEMI) myocardial infarction: Principal | ICD-10-CM | POA: Diagnosis present

## 2022-02-12 DIAGNOSIS — K219 Gastro-esophageal reflux disease without esophagitis: Secondary | ICD-10-CM | POA: Diagnosis not present

## 2022-02-12 DIAGNOSIS — Z6830 Body mass index (BMI) 30.0-30.9, adult: Secondary | ICD-10-CM

## 2022-02-12 DIAGNOSIS — I10 Essential (primary) hypertension: Secondary | ICD-10-CM | POA: Diagnosis not present

## 2022-02-12 DIAGNOSIS — Z955 Presence of coronary angioplasty implant and graft: Secondary | ICD-10-CM | POA: Diagnosis not present

## 2022-02-12 DIAGNOSIS — Z8249 Family history of ischemic heart disease and other diseases of the circulatory system: Secondary | ICD-10-CM

## 2022-02-12 DIAGNOSIS — E669 Obesity, unspecified: Secondary | ICD-10-CM | POA: Diagnosis present

## 2022-02-12 DIAGNOSIS — R0789 Other chest pain: Secondary | ICD-10-CM | POA: Diagnosis not present

## 2022-02-12 DIAGNOSIS — Z87891 Personal history of nicotine dependence: Secondary | ICD-10-CM

## 2022-02-12 DIAGNOSIS — Z833 Family history of diabetes mellitus: Secondary | ICD-10-CM

## 2022-02-12 DIAGNOSIS — R0602 Shortness of breath: Secondary | ICD-10-CM | POA: Diagnosis not present

## 2022-02-12 DIAGNOSIS — R079 Chest pain, unspecified: Secondary | ICD-10-CM | POA: Diagnosis not present

## 2022-02-12 DIAGNOSIS — F32A Depression, unspecified: Secondary | ICD-10-CM | POA: Diagnosis present

## 2022-02-12 LAB — CBC
HCT: 42.2 % (ref 39.0–52.0)
Hemoglobin: 13.5 g/dL (ref 13.0–17.0)
MCH: 28.5 pg (ref 26.0–34.0)
MCHC: 32 g/dL (ref 30.0–36.0)
MCV: 89 fL (ref 80.0–100.0)
Platelets: 285 10*3/uL (ref 150–400)
RBC: 4.74 MIL/uL (ref 4.22–5.81)
RDW: 13.6 % (ref 11.5–15.5)
WBC: 7.9 10*3/uL (ref 4.0–10.5)
nRBC: 0 % (ref 0.0–0.2)

## 2022-02-12 LAB — PROTIME-INR
INR: 1 (ref 0.8–1.2)
Prothrombin Time: 12.7 seconds (ref 11.4–15.2)

## 2022-02-12 LAB — BASIC METABOLIC PANEL
Anion gap: 10 (ref 5–15)
BUN: 18 mg/dL (ref 8–23)
CO2: 23 mmol/L (ref 22–32)
Calcium: 8.9 mg/dL (ref 8.9–10.3)
Chloride: 108 mmol/L (ref 98–111)
Creatinine, Ser: 1.21 mg/dL (ref 0.61–1.24)
GFR, Estimated: >60 mL/min (ref >60)
Glucose, Bld: 140 mg/dL — ABNORMAL HIGH (ref 70–99)
Potassium: 3.8 mmol/L (ref 3.5–5.1)
Sodium: 141 mmol/L (ref 135–145)

## 2022-02-12 LAB — APTT: aPTT: 24 seconds (ref 24–36)

## 2022-02-12 LAB — TROPONIN I (HIGH SENSITIVITY): Troponin I (High Sensitivity): 1061 ng/L (ref <18)

## 2022-02-12 MED ORDER — NITROGLYCERIN 0.4 MG SL SUBL
0.4000 mg | SUBLINGUAL_TABLET | SUBLINGUAL | Status: DC | PRN
  Administered 2022-02-12 – 2022-02-13 (×5): 0.4 mg via SUBLINGUAL
  Filled 2022-02-12 (×2): qty 1

## 2022-02-12 MED ORDER — HEPARIN BOLUS VIA INFUSION: 5000.0000 [IU] | Freq: Once | INTRAVENOUS | Status: DC

## 2022-02-12 MED ORDER — HEPARIN (PORCINE) 25000 UT/250ML-% IV SOLN
1550.0000 [IU]/h | INTRAVENOUS | Status: DC
  Administered 2022-02-12: 1200 [IU]/h via INTRAVENOUS
  Administered 2022-02-13: 1350 [IU]/h via INTRAVENOUS
  Administered 2022-02-14: 1550 [IU]/h via INTRAVENOUS
  Filled 2022-02-12 (×4): qty 250

## 2022-02-12 MED ORDER — HEPARIN (PORCINE) 25000 UT/250ML-% IV SOLN: 12.0000 [IU]/kg/h | INTRAVENOUS | Status: DC

## 2022-02-12 MED ORDER — ASPIRIN 81 MG PO CHEW: 324.0000 mg | CHEWABLE_TABLET | Freq: Once | ORAL | Status: DC

## 2022-02-12 MED ORDER — HEPARIN BOLUS VIA INFUSION
4000.0000 [IU] | Freq: Once | INTRAVENOUS | Status: AC
  Administered 2022-02-12: 4000 [IU] via INTRAVENOUS
  Filled 2022-02-12: qty 4000

## 2022-02-12 NOTE — ED Provider Notes (Signed)
Peter Williams EMERGENCY DEPARTMENT AT [facility] Provider Note   CSN: [id number] Arrival date & time: 02/12/22  2047     History  No chief complaint on file.   Peter Williams is a 67 y.o. male.  HPI   67 year old male with past medical history of high cholesterol presents emergency department with chest/left arm pain.  Patient states throughout the day with physical activity he was experiencing chest heaviness.  He has persistent left upper extremity pain.  About an hour and a half prior to arrival when he was walking into a store was when the chest heaviness was most severe.  Currently at rest he has no chest symptoms, just ongoing left arm pain.  No cardiac history.  Pain does not radiate to the back or abdomen.  No swelling of his lower extremities.  Home Medications Prior to Admission medications   Medication Sig Start Date End Date Taking? Authorizing Provider  AMBULATORY NON FORMULARY MEDICATION Medication Name: Diltiazem 2%/ Lidocaine 5% Using your index finger, apply a small amount of medication inside the rectum up to your first knuckle/joint three times daily x 6-8 weeks. 06/29/21   Esterwood, Amy S, PA-C  Bioflavonoid Products (ESTER C PO) Take 1 tablet by mouth daily.    [provider]  DEXILANT 60 MG capsule Take 1 capsule by mouth once daily 05/21/19   Gatha Mayer, MD      Allergies    Patient has no known allergies.    Review of Systems   Review of Systems  Constitutional:  Positive for fatigue. Negative for fever.  Respiratory:  Positive for chest tightness and shortness of breath.   Cardiovascular:  Positive for chest pain. Negative for palpitations and leg swelling.  Gastrointestinal:  Negative for abdominal pain, diarrhea and vomiting.  Genitourinary:  Negative for flank pain.  Musculoskeletal:  Negative for back pain.  Skin:  Negative for rash.  Neurological:  Negative for headaches.    Physical Exam Updated Vital Signs BP (!)  165/71 (BP Location: Left Arm)   Pulse 92   Temp 97.7 F (36.5 C) (Oral)   Resp 18   Wt 96.6 kg   SpO2 99%   BMI 30.56 kg/m  Physical Exam Vitals and nursing note reviewed.  Constitutional:      General: He is not in acute distress.    Appearance: Normal appearance.  HENT:     Head: Normocephalic.     Mouth/Throat:     Mouth: Mucous membranes are moist.  Cardiovascular:     Rate and Rhythm: Normal rate.  Pulmonary:     Effort: Pulmonary effort is normal. No respiratory distress.  Abdominal:     Palpations: Abdomen is soft.     Tenderness: There is no abdominal tenderness.  Skin:    General: Skin is warm.  Neurological:     Mental Status: He is alert and oriented to person, place, and time. Mental status is at baseline.  Psychiatric:        Mood and Affect: Mood normal.     ED Results / Procedures / Treatments   Labs (all labs ordered are listed, but only abnormal results are displayed) Labs Reviewed  BASIC METABOLIC PANEL - Abnormal; Notable for the following components:      Result Value   Glucose, Bld 140 (*)    All other components within normal limits  TROPONIN I (HIGH SENSITIVITY) - Abnormal; Notable for the following components:   Troponin I (High Sensitivity)  1,061 (*)    All other components within normal limits  CBC  APTT  PROTIME-INR  CBC    EKG EKG Interpretation  Date/Time:  Friday February 12 2022 21:07:33 EST Ventricular Rate:  92 PR Interval:  159 QRS Duration: 89 QT Interval:  400 QTC Calculation: 495 R Axis:   -51 Text Interpretation: Sinus rhythm Abnormal R-wave progression, early transition Inferior infarct, old Confirmed by Lavenia Atlas [id number]) on 02/12/2022 9:56:12 PM  Radiology DG Chest Port 1 View  Result Date: 02/12/2022 CLINICAL DATA:  Chest pain. EXAM: PORTABLE CHEST 1 VIEW COMPARISON:  Chest radiograph dated 01/02/2008. FINDINGS: No focal consolidation, pleural effusion, or pneumothorax. The cardiac silhouette is within  limits. Atherosclerotic calcification of the aortic arch. No acute osseous pathology. IMPRESSION: No active disease. Electronically Signed   By: Anner Crete M.D.   On: 02/12/2022 21:57    Procedures .Critical Care  Performed by: Lorelle Gibbs, DO Authorized by: Lorelle Gibbs, DO   Critical care provider statement:    Critical care time (minutes):  30   Critical care was necessary to treat or prevent imminent or life-threatening deterioration of the following conditions:  Cardiac failure   Critical care was time spent personally by me on the following activities:  Development of treatment plan with patient or surrogate, discussions with consultants, evaluation of patient's response to treatment, examination of patient, ordering and review of laboratory studies, ordering and review of radiographic studies, ordering and performing treatments and interventions, pulse oximetry, re-evaluation of patient's condition and review of old charts   I assumed direction of critical care for this patient from another provider in my specialty: no     Care discussed with: admitting provider       Medications Ordered in ED Medications  aspirin chewable tablet 324 mg (has no administration in time range)  nitroGLYCERIN (NITROSTAT) SL tablet 0.4 mg (has no administration in time range)  heparin bolus via infusion 4,000 Units (has no administration in time range)  heparin ADULT infusion 100 units/mL (25000 units/248m) (has no administration in time range)    ED Course/ Medical Decision Making/ A&P                             Medical Decision Making Amount and/or Complexity of Data Reviewed Labs: ordered.  Risk OTC drugs. Prescription drug management.   67year old male presents emergency department intermittent chest heaviness and ongoing left upper extremity pain.  No cardiac history.  Vitals are stable on arrival.  No radiation of pain to the back/abdomen.  EKG is sinus rhythm.  ST  elevation in aVR with some slight ST depression, no STEMI criteria.  At rest patient is chest pain-free, mild ongoing left upper extremity pain.  Initial troponin is elevated over thousand.  Spoke with on-call cardiologist, Dr.Custovic who [facility]  We will follow-up repeat troponin, if significant increase will speak with cardiology again and anticipate more emergent cath.  Patient took 2 doses of aspirin today, will order heparin bolus/drip per pharmacy.  Patients evaluation and results requires admission for further treatment and care.  Spoke with hospitalist , reviewed patient's ED course and they accept admission.  Patient agrees with admission plan, offers no new complaints and is stable/unchanged at time of admit.        Final Clinical Impression(s) / ED Diagnoses Final diagnoses:  None    Rx / DC Orders ED Discharge Orders  None         Lorelle Gibbs, DO 02/12/22 2224

## 2022-02-12 NOTE — ED Notes (Signed)
Attempted to call report to [facility] no response, will try at later time.

## 2022-02-12 NOTE — Progress Notes (Signed)
ANTICOAGULATION CONSULT NOTE - Initial Consult  Pharmacy Consult for heparin Indication: chest pain/ACS  No Known Allergies  Patient Measurements: Weight: 96.6 kg (213 lb) Heparin Dosing Weight: 96.6kg  Vital Signs: Temp: 97.7 F (36.5 C) (02/09 2055) Temp Source: Oral (02/09 2055) BP: 165/71 (02/09 2055) Pulse Rate: 92 (02/09 2055)  Labs: Recent Labs    02/12/22 2040  HGB 13.5  HCT 42.2  PLT 285  CREATININE 1.21  TROPONINIHS 1,061*    CrCl cannot be calculated (Unknown ideal weight.).   Medical History: Past Medical History:  Diagnosis Date   Bruit of right carotid artery    patient unaware   DDD (degenerative disc disease), lumbosacral    Gastritis    GERD (gastroesophageal reflux disease)    Peptic ulcer disease      Assessment: 67 yo male with intermittent chest pain that started today, feels like pressure, radiates into the L arm.  Pharmacy consulted to dose heparin drip for NSTEMI.  No prior AC noted  CBC WNL, Trop 1061, Scr 1.1  Goal of Therapy:  Heparin level 0.3-0.7 units/ml Monitor platelets by anticoagulation protocol: Yes   Plan:  Baseline labs STAT Heparin bolus 4000 units x 1 Start heparin drip at 1200 units/hr Heparin level in 6 hours Daily CBC    Dolly Rias RPh 02/12/2022, 10:14 PM

## 2022-02-12 NOTE — ED Triage Notes (Signed)
Intermittent CP that started today, feels like pressure, radiates into L arm.Was walking when pain first occurred.   Denies dizziness Endorses SOB No heart history, nonsmoker  Took 22m ASA at 4p and a second ASA pta.   H/o HLD

## 2022-02-13 ENCOUNTER — Inpatient Hospital Stay (HOSPITAL_COMMUNITY): Payer: Medicare Other

## 2022-02-13 ENCOUNTER — Encounter (HOSPITAL_COMMUNITY): Payer: Self-pay | Admitting: Internal Medicine

## 2022-02-13 DIAGNOSIS — I214 Non-ST elevation (NSTEMI) myocardial infarction: Secondary | ICD-10-CM

## 2022-02-13 LAB — ECHOCARDIOGRAM COMPLETE
AR max vel: 1.52 cm2
AV Area VTI: 2.07 cm2
AV Area mean vel: 1.7 cm2
AV Mean grad: 2 mmHg
AV Peak grad: 5.2 mmHg
Ao pk vel: 1.14 m/s
Area-P 1/2: 4.26 cm2
S' Lateral: 2.8 cm
Weight: 3408 oz

## 2022-02-13 LAB — LIPID PANEL
Cholesterol: 187 mg/dL (ref 0–200)
HDL: 53 mg/dL (ref >40)
LDL Cholesterol: 116 mg/dL — ABNORMAL HIGH (ref 0–99)
Total CHOL/HDL Ratio: 3.5 RATIO
Triglycerides: 92 mg/dL (ref <150)
VLDL: 18 mg/dL (ref 0–40)

## 2022-02-13 LAB — GLUCOSE, CAPILLARY
Glucose-Capillary: 124 mg/dL — ABNORMAL HIGH (ref 70–99)
Glucose-Capillary: 96 mg/dL (ref 70–99)
Glucose-Capillary: 158 mg/dL — ABNORMAL HIGH (ref 70–99)

## 2022-02-13 LAB — HEPARIN LEVEL (UNFRACTIONATED)
Heparin Unfractionated: 0.27 IU/mL — ABNORMAL LOW (ref 0.30–0.70)
Heparin Unfractionated: 0.27 IU/mL — ABNORMAL LOW (ref 0.30–0.70)
Heparin Unfractionated: 0.46 IU/mL (ref 0.30–0.70)

## 2022-02-13 LAB — HEMOGLOBIN A1C
Hgb A1c MFr Bld: 5.9 % — ABNORMAL HIGH (ref 4.8–5.6)
Mean Plasma Glucose: 122.63 mg/dL

## 2022-02-13 MED ORDER — MELATONIN 3 MG PO TABS: 3.0000 mg | ORAL_TABLET | Freq: Every evening | ORAL | Status: DC | PRN

## 2022-02-13 MED ORDER — ASPIRIN 81 MG PO TBEC
162.0000 mg | DELAYED_RELEASE_TABLET | Freq: Once | ORAL | Status: AC
  Administered 2022-02-13: 162 mg via ORAL
  Filled 2022-02-13: qty 2

## 2022-02-13 MED ORDER — HYDROMORPHONE HCL 1 MG/ML IJ SOLN: 0.5000 mg | INTRAMUSCULAR | Status: DC | PRN

## 2022-02-13 MED ORDER — PROCHLORPERAZINE EDISYLATE 10 MG/2ML IJ SOLN: 5.0000 mg | Freq: Four times a day (QID) | INTRAMUSCULAR | Status: DC | PRN

## 2022-02-13 MED ORDER — PERFLUTREN LIPID MICROSPHERE
1.0000 mL | INTRAVENOUS | Status: AC | PRN
  Administered 2022-02-13: 2 mL via INTRAVENOUS

## 2022-02-13 MED ORDER — PANTOPRAZOLE SODIUM 40 MG PO TBEC
40.0000 mg | DELAYED_RELEASE_TABLET | Freq: Every day | ORAL | Status: DC
  Administered 2022-02-13 – 2022-02-16 (×4): 40 mg via ORAL
  Filled 2022-02-13 (×4): qty 1

## 2022-02-13 MED ORDER — ACETAMINOPHEN 325 MG PO TABS: 650.0000 mg | ORAL_TABLET | Freq: Four times a day (QID) | ORAL | Status: DC | PRN

## 2022-02-13 MED ORDER — POLYETHYLENE GLYCOL 3350 17 G PO PACK: 17.0000 g | PACK | Freq: Every day | ORAL | Status: DC | PRN

## 2022-02-13 MED ORDER — CARVEDILOL 6.25 MG PO TABS
6.2500 mg | ORAL_TABLET | Freq: Two times a day (BID) | ORAL | Status: DC
  Administered 2022-02-13 – 2022-02-16 (×7): 6.25 mg via ORAL
  Filled 2022-02-13 (×7): qty 1

## 2022-02-13 MED ORDER — INSULIN ASPART 100 UNIT/ML IJ SOLN
0.0000 [IU] | INTRAMUSCULAR | Status: DC
  Filled 2022-02-13: qty 0.09

## 2022-02-13 NOTE — Progress Notes (Signed)
Altha for heparin Indication: chest pain/ACS  No Known Allergies  Patient Measurements: Weight: 96.6 kg (213 lb) Heparin Dosing Weight: 96.6kg  Vital Signs: Temp: 97.9 F (36.6 C) (02/10 1957) Temp Source: Oral (02/10 1957) BP: 139/76 (02/10 1957) Pulse Rate: 75 (02/10 1957)  Labs: Recent Labs    02/12/22 2040 02/12/22 2240 02/13/22 0452 02/13/22 1424 02/13/22 2140  HGB 13.5  --   --   --   --   HCT 42.2  --   --   --   --   PLT 285  --   --   --   --   APTT  --  24  --   --   --   LABPROT  --  12.7  --   --   --   INR  --  1.0  --   --   --   HEPARINUNFRC  --   --  0.27* 0.27* 0.46  CREATININE 1.21  --   --   --   --   TROPONINIHS 1,061*  --   --   --   --      CrCl cannot be calculated (Unknown ideal weight.).   Medical History: Past Medical History:  Diagnosis Date   Bruit of right carotid artery    patient unaware   DDD (degenerative disc disease), lumbosacral    Gastritis    GERD (gastroesophageal reflux disease)    Peptic ulcer disease      Assessment: 67 yo male with intermittent chest pain that started 2/9, feels like pressure, radiates into the L arm.  Pharmacy consulted to dose heparin drip for NSTEMI.  No prior AC noted  HL 0.46 which is therapeutic. CBC WNL  No issues with infusion or bleeding noted.   Goal of Therapy:  Heparin level 0.3-0.7 units/ml Monitor platelets by anticoagulation protocol: Yes   Plan:  Continue heparin at 1550 units/hr Check daily heparin level/CBC Monitor for s/sx of bleeding  Alanda Slim, PharmD, [facility] Clinical Pharmacist Please see AMION for all Pharmacists' Contact Phone Numbers 02/13/2022, 10:27 PM

## 2022-02-13 NOTE — Progress Notes (Signed)
[place] for heparin Indication: chest pain/ACS  No Known Allergies  Patient Measurements: Weight: 96.6 kg (213 lb) Heparin Dosing Weight: 96.6kg  Vital Signs: Temp: 97.6 F (36.4 C) (02/10 0115) Temp Source: Oral (02/10 0115) BP: 124/73 (02/10 0207) Pulse Rate: 80 (02/10 0207)  Labs: Recent Labs    02/12/22 2040 02/12/22 2240 02/13/22 0452  HGB 13.5  --   --   HCT 42.2  --   --   PLT 285  --   --   APTT  --  24  --   LABPROT  --  12.7  --   INR  --  1.0  --   HEPARINUNFRC  --   --  0.27*  CREATININE 1.21  --   --   TROPONINIHS 1,061*  --   --      CrCl cannot be calculated (Unknown ideal weight.).   Medical History: Past Medical History:  Diagnosis Date   Bruit of right carotid artery    patient unaware   DDD (degenerative disc disease), lumbosacral    Gastritis    GERD (gastroesophageal reflux disease)    Peptic ulcer disease      Assessment: 67 yo male with intermittent chest pain that started today, feels like pressure, radiates into the L arm.  Pharmacy consulted to dose heparin drip for NSTEMI.  No prior AC noted  CBC WNL, Trop 1061, Scr 1.1  2/10 AM update:  Heparin level just below goal  Goal of Therapy:  Heparin level 0.3-0.7 units/ml Monitor platelets by anticoagulation protocol: Yes   Plan:  Inc heparin to 1350 units/hr 1400 heparin level  Narda Bonds, PharmD, BCPS Clinical Pharmacist Phone: [phone]

## 2022-02-13 NOTE — Progress Notes (Signed)
Progress Note  Patient Name: Peter Williams Date of Encounter: 02/13/2022  Primary Cardiologist: None  Subjective   Chest pain free after a few hours of arriving to the ER. Currently chest pain free. On heparin drip.  Inpatient Medications    Scheduled Meds:  carvedilol  6.25 mg Oral BID WC   insulin aspart  0-9 Units Subcutaneous Q4H   pantoprazole  40 mg Oral Daily   Continuous Infusions:  heparin 1,350 Units/hr (02/13/22 0639)   PRN Meds: acetaminophen, HYDROmorphone (DILAUDID) injection, melatonin, nitroGLYCERIN, polyethylene glycol, prochlorperazine   Vital Signs    Vitals:   02/13/22 0207 02/13/22 0752 02/13/22 0846 02/13/22 0900  BP: 124/73 136/69 132/72   Pulse: 80 77 80   Resp:  18 18   Temp:  97.6 F (36.4 C)    TempSrc:  Oral    SpO2: 97% 98%  98%  Weight:        Intake/Output Summary (Last 24 hours) at 02/13/2022 0932 Last data filed at 02/13/2022 0754 Gross per 24 hour  Intake 135.4 ml  Output 500 ml  Net -364.6 ml   Filed Weights   02/12/22 2056  Weight: 96.6 kg    Telemetry     Personally reviewed, NSR  ECG    NSR with no ST-T changes  Physical Exam   GEN: No acute distress.   Neck: No JVD. Cardiac: RRR, no murmur, rub, or gallop.  Respiratory: Nonlabored. Clear to auscultation bilaterally. GI: Soft, nontender, bowel sounds present. MS: No edema; No deformity. Neuro:  Nonfocal. Psych: Alert and oriented x 3. Normal affect.  Labs    Chemistry Recent Labs  Lab 02/12/22 2040  NA 141  K 3.8  CL 108  CO2 23  GLUCOSE 140*  BUN 18  CREATININE 1.21  CALCIUM 8.9  GFRNONAA >60  ANIONGAP 10     Hematology Recent Labs  Lab 02/12/22 2040  WBC 7.9  RBC 4.74  HGB 13.5  HCT 42.2  MCV 89.0  MCH 28.5  MCHC 32.0  RDW 13.6  PLT 285    Cardiac Enzymes Recent Labs  Lab 02/12/22 2040  TROPONINIHS 1,061*    BNPNo results for input(s): "BNP", "PROBNP" in the last 168 hours.   DDimerNo results for input(s): "DDIMER"  in the last 168 hours.   Radiology    DG Chest Port 1 View  Result Date: 02/12/2022 CLINICAL DATA:  Chest pain. EXAM: PORTABLE CHEST 1 VIEW COMPARISON:  Chest radiograph dated 01/02/2008. FINDINGS: No focal consolidation, pleural effusion, or pneumothorax. The cardiac silhouette is within limits. Atherosclerotic calcification of the aortic arch. No acute osseous pathology. IMPRESSION: No active disease. Electronically Signed   By: Anner Crete M.D.   On: 02/12/2022 21:57    Cardiac Studies   Echocardiogram pending  Assessment & Plan    # NSTEMI -Continue ACS protocol with aspirin, high intensity statin and heparin drip -Patient is posted for [facility] on Monday. No contrast allergy. Normal serum creatinine. Risks and benefits of cardiac catheterization have been discussed with the patient.  These include bleeding, infection, kidney damage, stroke, heart attack, death.  The patient understands these risks and is willing to proceed. -Obtain 2D echocardiogram -Obtain lipid panel -Trend troponins x 3  # HTN, controlled -Continue carvedilol 6.25 mg twice daily  I have spent a total of 33 minutes with patient reviewing chart , telemetry, EKGs, labs and examining patient as well as establishing an assessment and plan that was discussed with the patient.  >  50% of time was spent in direct patient care.      Signed, Chalmers Guest, MD  02/13/2022, 9:32 AM

## 2022-02-13 NOTE — Progress Notes (Signed)
Patient admitted after midnight please see H&P.  Here with NSTEMI.  Chest pain free currently on heparin gtt.  Plan for [facility] on Monday. Eulogio Bear DO

## 2022-02-13 NOTE — Consult Note (Addendum)
Cardiology Consultation   Patient ID: CONSTANCIO DOBKIN MRN: [id number]; DOB: Aug 05, 1955  Admit date: 02/12/2022 Date of Consult: 02/13/2022  PCP:  Sharilyn Sites, [place] Providers Cardiologist:  None        Patient Profile:   Peter Williams is a 67 y.o. male with a hx of GERD and is otherwise healthy who is being seen 02/13/2022 for the evaluation of chest pain and NSTEMI at the request of Dr. Irene Pap of [facility].  History of Present Illness:   Peter Williams was in his usual state of health until the 9am on the morning presentation when he developed stuttering chest pain. He describes the pain as centered in his chest with radiation to his jaw and left arm.   When he first developed the chest pain in the morning, his wife suggested that he go to the ER for evaluation, but he put it off. However, throughout the day the pain would ebb and flow. While he was running errands in the afternoon/evening with his wife, he developed severe 10/10 chest pain that he again describes as pressure as he was walking across the parking lot,  "it felt like 3 cinder blocks just siting on my chest." He and his wife decided to present to the ER at that time.    In the ED, he was hypertensive with SBP in the 140-160s. 12 lead EKG showed left axis deviation, early R wave progression, but otherwise no dynamic ST changes. Initial troponin was 1061.His other labs were within normal limits. He was started on a heparin gtt and transferred from [facility] to [facility].     At the time of my interview, the patient states that he is comfortable and denies any active chest pain, exertional chest pressure/discomfort, dyspnea/tachypnea, paroxysmal nocturnal dyspnea/orthopnea, irregular heart beat/palpitations, presyncope/syncope, lower extremity edema, claudication, or abdominal distention. There are no clear localizing signs or symptoms of infection.    ED Course: Tmax 97.7.  BP 131/69, pulse 69, r  Past  Medical History:  Diagnosis Date   Bruit of right carotid artery    patient unaware   DDD (degenerative disc disease), lumbosacral    Gastritis    GERD (gastroesophageal reflux disease)    Peptic ulcer disease     Past Surgical History:  Procedure Laterality Date   BACK SURGERY     S5 and L3 vertabrae repair   BILROTH II PROCEDURE  1981   bilroth II, revised with bile-reflux bypass   COLONOSCOPY     ESOPHAGOGASTRODUODENOSCOPY     INCISIONAL HERNIA REPAIR N/A 06/09/2017   Procedure: LAPAROSCOPIC VENTRAL INCISIONAL HERNIA REPAIR WITH MESH;  Surgeon: Alphonsa Overall, MD;  Location: [facility];  Service: General;  Laterality: N/A;   INSERTION OF MESH N/A 06/09/2017   Procedure: INSERTION OF MESH;  Surgeon: Alphonsa Overall, MD;  Location: [facility];  Service: General;  Laterality: N/A;     Home Medications:  Prior to Admission medications   Medication Sig Start Date End Date Taking? Authorizing Provider  AMBULATORY NON FORMULARY MEDICATION Medication Name: Diltiazem 2%/ Lidocaine 5% Using your index finger, apply a small amount of medication inside the rectum up to your first knuckle/joint three times daily x 6-8 weeks. 06/29/21   Esterwood, Amy S, PA-C  Bioflavonoid Products (ESTER C PO) Take 1 tablet by mouth daily.    [provider]  DEXILANT 60 MG capsule Take 1 capsule by mouth once daily 05/21/19   Gatha Mayer, MD  Inpatient Medications: Scheduled Meds:  carvedilol  6.25 mg Oral BID WC   insulin aspart  0-9 Units Subcutaneous Q4H   pantoprazole  40 mg Oral Daily   Continuous Infusions:  heparin 1,200 Units/hr (02/12/22 2241)   PRN Meds: acetaminophen, HYDROmorphone (DILAUDID) injection, melatonin, nitroGLYCERIN, polyethylene glycol, prochlorperazine  Allergies:   No Known Allergies  Social History:   Social History   Socioeconomic History   Marital status: Married    Spouse name: Not on file   Number of children: 1   Years of education: Not on file   Highest  education level: Not on file  Occupational History   Occupation: Scientist, water quality  Tobacco Use   Smoking status: Former    Packs/day: 3.00    Years: 8.00    Total pack years: 24.00    Types: Cigarettes    Quit date: 07/11/2006    Years since quitting: 15.6   Smokeless tobacco: Never  Vaping Use   Vaping Use: Never used  Substance and Sexual Activity   Alcohol use: No    Alcohol/week: 0.0 standard drinks of alcohol   Drug use: Never   Sexual activity: Not on file  Other Topics Concern   Not on file  Social History Narrative   Divorced   1 daughter   Scientist, water quality at [employer]   2 [employer] daily   07/13/2014   Social Determinants of Health   Financial Resource Strain: Not on file  Food Insecurity: Not on file  Transportation Needs: Not on file  Physical Activity: Not on file  Stress: Not on file  Social Connections: Not on file  Intimate Partner Violence: Not on file    Family History:    Family History  Problem Relation Age of Onset   Alzheimer's disease Mother    Diabetes Brother    Colon cancer Neg Hx    Esophageal cancer Neg Hx    Rectal cancer Neg Hx    Stomach cancer Neg Hx      ROS:  Please see the history of present illness.   All other ROS reviewed and negative.     Physical Exam/Data:   Vitals:   02/13/22 0057 02/13/22 0115 02/13/22 0200 02/13/22 0207  BP:  (!) 150/94 128/81 124/73  Pulse:  80 80 80  Resp:      Temp: 97.9 F (36.6 C) 97.6 F (36.4 C)    TempSrc: Oral Oral    SpO2:  98% 96% 97%  Weight:       No intake or output data in the 24 hours ending 02/13/22 0501    02/12/2022    8:56 PM 06/29/2021    1:14 PM 01/15/2019    2:51 PM  Last 3 Weights  Weight (lbs) 213 lb 209 lb 207 lb 9.6 oz  Weight (kg) 96.616 kg 94.802 kg 94.167 kg     Body mass index is 30.56 kg/m.  General:  Well nourished, well developed, in no acute distress HEENT: normal Neck: no JVD Vascular: No carotid bruits; Distal pulses 2+ bilaterally Cardiac:  normal S1,  S2; RRR; no murmur/rubs/gallops Lungs:  clear to auscultation bilaterally, no wheezing, rhonchi or rales  Abd: soft, nontender, no hepatomegaly  Ext: no edema Musculoskeletal:  No deformities, BUE and BLE strength normal and equal Skin: warm and dry  Neuro:  No gross abnormalities noted Psych:  Normal affect   EKG:  The EKG was personally reviewed and demonstrates:  sinus rhythm with early precordial transition and  no other dynamic changes.  Laboratory Data:  High Sensitivity Troponin:   Recent Labs  Lab 02/12/22 2040  TROPONINIHS 1,061*     Chemistry Recent Labs  Lab 02/12/22 2040  NA 141  K 3.8  CL 108  CO2 23  GLUCOSE 140*  BUN 18  CREATININE 1.21  CALCIUM 8.9  GFRNONAA >60  ANIONGAP 10    No results for input(s): "PROT", "ALBUMIN", "AST", "ALT", "ALKPHOS", "BILITOT" in the last 168 hours. Lipids No results for input(s): "CHOL", "TRIG", "HDL", "LABVLDL", "LDLCALC", "CHOLHDL" in the last 168 hours.  Hematology Recent Labs  Lab 02/12/22 2040  WBC 7.9  RBC 4.74  HGB 13.5  HCT 42.2  MCV 89.0  MCH 28.5  MCHC 32.0  RDW 13.6  PLT 285   Thyroid No results for input(s): "TSH", "FREET4" in the last 168 hours.  BNPNo results for input(s): "BNP", "PROBNP" in the last 168 hours.  DDimer No results for input(s): "DDIMER" in the last 168 hours.   Radiology/Studies:  [facility] Chest Port 1 View  Result Date: 02/12/2022 CLINICAL DATA:  Chest pain. EXAM: PORTABLE CHEST 1 VIEW COMPARISON:  Chest radiograph dated 01/02/2008. FINDINGS: No focal consolidation, pleural effusion, or pneumothorax. The cardiac silhouette is within limits. Atherosclerotic calcification of the aortic arch. No acute osseous pathology. IMPRESSION: No active disease. Electronically Signed   By: Anner Crete M.D.   On: 02/12/2022 21:57     Assessment and Plan:   Peter Williams is a 67 y.o. male with a hx of GERD and is otherwise healthy who is being seen 02/13/2022 for the evaluation of chest pain and  NSTEMI.   # NSTEMI # Chest Pain # HTN - Plan for diagnostic [facility] on Monday - Patient to be NPO except for meds at [facility] on 2/12 - Continue heparin IV infusion protocol - Start carvedilol 6.62m BID; uptitrate as tolerated from a BP standpoint - ASA 867mdaily - Symptom management with prn SLNTG - Trend trop and serial ECGs - TTE in AM - Aggressive risk factor modification with glycemic control, smoking cessation, and GDMT (to be started once more objective data obtained; statin, ACEi)             - Lipid panel and A1c pending     Risk Assessment/Risk Scores:     TIMI Risk Score for Unstable Angina or Non-ST Elevation MI:   The patient's TIMI risk score is 3, which indicates a 13% risk of all cause mortality, new or recurrent myocardial infarction or need for urgent revascularization in the next 14 days.          For questions or updates, please contact [place] consult [email or web address] for contact info under    Signed, FrClois DupesMD  02/13/2022 5:01 AM

## 2022-02-13 NOTE — H&P (Incomplete)
History and Physical  Peter Williams [id number] DOB: 1955-03-13 DOA: 02/12/2022  Referring physician: Dr. Dina Rich, EDP  PCP: Sharilyn Sites, MD  Outpatient Specialists: None Patient coming from: Home  Chief Complaint: Chest pain   HPI: Peter Williams is a 67 y.o. male with medical history significant for GERD, who presented to [facility] ED due to exertional chest pain.  He describes his chest pain as heavy pressure like on the central part of his chest, starting from his left arm and radiating to the central part of his chest.  Associated with shortness of breath.  Worse with ambulation and improved at rest.  No prior history of heart disease.  No similar prior symptoms.  Endorses a family history of heart disease in his mother.  No use of tobacco alcohol or illicit drugs.  He took a baby aspirin around 4 PM and again around 9 PM.  His wife drove him to the hospital for further evaluation.  In the ED, afebrile with elevated BPs.  Lab studies notable for significantly elevated first set of high-sensitivity troponin 1061.  Twelve-lead EKG notable for ST T depression involving inferior leads.  The patient was started on heparin drip in the ED.  Admitted by [facility], hospitalist service, and transferred to [facility].  Cardiology consulted and will see in consultation.  At the time of this visit, the patient had persistent left arm pain and his chest pain is recurrent with exertion.  Discussed the case with cardiology, Dr. Vickki Muff, who will see the patient once he arrives to the hospital.  ED Course: Tmax 97.7.  BP 131/69, pulse 69, respiratory 16, O2 saturation 97% on room air.  Lab studies remarkable for serum glucose 140.  Creatinine 1.21 with GFR greater than 60.  CBC essentially unremarkable.  First set of high-sensitivity troponin 1061.  Second set of troponin is pending.  Review of Systems: Review of systems as noted in the HPI. All other systems reviewed and are negative.   Past Medical  History:  Diagnosis Date   Bruit of right carotid artery    patient unaware   DDD (degenerative disc disease), lumbosacral    Gastritis    GERD (gastroesophageal reflux disease)    Peptic ulcer disease    Past Surgical History:  Procedure Laterality Date   BACK SURGERY     S5 and L3 vertabrae repair   BILROTH II PROCEDURE  1981   bilroth II, revised with bile-reflux bypass   COLONOSCOPY     ESOPHAGOGASTRODUODENOSCOPY     INCISIONAL HERNIA REPAIR N/A 06/09/2017   Procedure: LAPAROSCOPIC VENTRAL INCISIONAL HERNIA REPAIR WITH MESH;  Surgeon: Alphonsa Overall, MD;  Location: [facility];  Service: General;  Laterality: N/A;   INSERTION OF MESH N/A 06/09/2017   Procedure: INSERTION OF MESH;  Surgeon: Alphonsa Overall, MD;  Location: [facility];  Service: General;  Laterality: N/A;    Social History:  reports that he quit smoking about 15 years ago. His smoking use included cigarettes. He has a 24.00 pack-year smoking history. He has never used smokeless tobacco. He reports that he does not drink alcohol and does not use drugs.   No Known Allergies  Family History  Problem Relation Age of Onset   Alzheimer's disease Mother    Diabetes Brother    Colon cancer Neg Hx    Esophageal cancer Neg Hx    Rectal cancer Neg Hx    Stomach cancer Neg Hx       Prior to Admission  medications   Medication Sig Start Date End Date Taking? Authorizing Provider  AMBULATORY NON FORMULARY MEDICATION Medication Name: Diltiazem 2%/ Lidocaine 5% Using your index finger, apply a small amount of medication inside the rectum up to your first knuckle/joint three times daily x 6-8 weeks. 06/29/21   Esterwood, Amy S, PA-C  Bioflavonoid Products (ESTER C PO) Take 1 tablet by mouth daily.    [provider]  DEXILANT 60 MG capsule Take 1 capsule by mouth once daily 05/21/19   Gatha Mayer, MD    Physical Exam: BP (!) 142/79   Pulse 78   Temp 97.7 F (36.5 C) (Oral)   Resp 14   Wt 96.6 kg   SpO2 98%   BMI  30.56 kg/m   General: 67 y.o. year-old male well developed well nourished in no acute distress.  Alert and oriented x3. Cardiovascular: Regular rate and rhythm with no rubs or gallops.  No thyromegaly or JVD noted.  No lower extremity edema. 2/4 pulses in all 4 extremities. Respiratory: Clear to auscultation with no wheezes or rales. Good inspiratory effort. Abdomen: Soft nontender nondistended with normal bowel sounds x4 quadrants. Muskuloskeletal: No cyanosis, clubbing or edema noted bilaterally Neuro: CN II-XII intact, strength, sensation, reflexes Skin: No ulcerative lesions noted or rashes Psychiatry: Judgement and insight appear normal. Mood is appropriate for condition and setting          Labs on Admission:  Basic Metabolic Panel: Recent Labs  Lab 02/12/22 2040  NA 141  K 3.8  CL 108  CO2 23  GLUCOSE 140*  BUN 18  CREATININE 1.21  CALCIUM 8.9   Liver Function Tests: No results for input(s): "AST", "ALT", "ALKPHOS", "BILITOT", "PROT", "ALBUMIN" in the last 168 hours. No results for input(s): "LIPASE", "AMYLASE" in the last 168 hours. No results for input(s): "AMMONIA" in the last 168 hours. CBC: Recent Labs  Lab 02/12/22 2040  WBC 7.9  HGB 13.5  HCT 42.2  MCV 89.0  PLT 285   Cardiac Enzymes: No results for input(s): "CKTOTAL", "CKMB", "CKMBINDEX", "TROPONINI" in the last 168 hours.  BNP (last 3 results) No results for input(s): "BNP" in the last 8760 hours.  ProBNP (last 3 results) No results for input(s): "PROBNP" in the last 8760 hours.  CBG: No results for input(s): "GLUCAP" in the last 168 hours.  Radiological Exams on Admission: DG Chest Port 1 View  Result Date: 02/12/2022 CLINICAL DATA:  Chest pain. EXAM: PORTABLE CHEST 1 VIEW COMPARISON:  Chest radiograph dated 01/02/2008. FINDINGS: No focal consolidation, pleural effusion, or pneumothorax. The cardiac silhouette is within limits. Atherosclerotic calcification of the aortic arch. No acute osseous  pathology. IMPRESSION: No active disease. Electronically Signed   By: Anner Crete M.D.   On: 02/12/2022 21:57    EKG: I independently viewed the EKG done and my findings are as followed: Sinus rhythm rate of 92.  Nonspecific ST-T changes.  QTc 495.  ST depression in inferior leads.  Assessment/Plan Present on Admission:  NSTEMI (non-ST elevated myocardial infarction) ( [place])  Principal Problem:   NSTEMI (non-ST elevated myocardial infarction) ([place])  NSTEMI Presented with chest pain, pressure-like, starting from his left arm and radiating to the central part of his chest.  Associated with dyspnea.  Worse with ambulation and improved at rest. First set of troponins greater than 1000.   Received 162 mg aspirin prior to presentation. Ordered another 162 mg aspirin to be administered. No ischemia on twelve-lead EKG, trending. Started on heparin drip, continue.  Analgesics as needed Follow fasting lipid panel, A1c, 2D echo. Closely monitor in stepdown unit Appreciate cardiology's assistance.  GERD Resume home PPI  Hyperglycemia Obtain hemoglobin A1c Start insulin sliding scale every 4 hours while NPO N.p.o. until seen by cardiology  QTc prolongation Twelve-lead EKG QTc 495 Avoid QTc prolonging agents Optimize magnesium and potassium levels   DVT prophylaxis: Heparin drip  Code Status: Full code  Family Communication: Updated his wife currently at bedside  Disposition Plan: Admitted to progressive care unit  Consults called: Cardiology  Admission status: Inpatient status.   Status is: Inpatient The patient requires at least 2 midnights for further evaluation and treatment of present condition.   Kayleen Memos MD [facility] Pager [phone]  If 7PM-7AM, please contact night-coverage [email or web address] Password [email or web address]  02/13/2022, 12:00 AM

## 2022-02-13 NOTE — Progress Notes (Signed)
[place] for heparin Indication: chest pain/ACS  No Known Allergies  Patient Measurements: Weight: 96.6 kg (213 lb) Heparin Dosing Weight: 96.6kg  Vital Signs: Temp: 98.2 F (36.8 C) (02/10 1334) Temp Source: Oral (02/10 1334) BP: 140/75 (02/10 1334) Pulse Rate: 77 (02/10 1334)  Labs: Recent Labs    02/12/22 2040 02/12/22 2240 02/13/22 0452  HGB 13.5  --   --   HCT 42.2  --   --   PLT 285  --   --   APTT  --  24  --   LABPROT  --  12.7  --   INR  --  1.0  --   HEPARINUNFRC  --   --  0.27*  CREATININE 1.21  --   --   TROPONINIHS 1,061*  --   --      CrCl cannot be calculated (Unknown ideal weight.).   Medical History: Past Medical History:  Diagnosis Date   Bruit of right carotid artery    patient unaware   DDD (degenerative disc disease), lumbosacral    Gastritis    GERD (gastroesophageal reflux disease)    Peptic ulcer disease      Assessment: 67 yo male with intermittent chest pain that started 2/9, feels like pressure, radiates into the L arm.  Pharmacy consulted to dose heparin drip for NSTEMI.  No prior AC noted  CBC WNL, Trop 1061, Scr 1.21.  No issues with infusion or bleeding noted.   Goal of Therapy:  Heparin level 0.3-0.7 units/ml Monitor platelets by anticoagulation protocol: Yes   Plan:  Inc heparin to 1550 units/hr Check 6 hour heparin level Check daily heparin level/CBC Monitor for s/sx of bleeding  Francena Hanly, PharmD Pharmacy Resident  02/13/2022 3:05 PM

## 2022-02-13 NOTE — ED Notes (Signed)
[facility] called for pt. Transportation to [facility]

## 2022-02-13 NOTE — ED Notes (Signed)
Report given to [place] charge nurse

## 2022-02-14 DIAGNOSIS — I10 Essential (primary) hypertension: Secondary | ICD-10-CM

## 2022-02-14 DIAGNOSIS — I214 Non-ST elevation (NSTEMI) myocardial infarction: Secondary | ICD-10-CM | POA: Diagnosis not present

## 2022-02-14 LAB — CBC
HCT: 37.7 % — ABNORMAL LOW (ref 39.0–52.0)
Hemoglobin: 12.9 g/dL — ABNORMAL LOW (ref 13.0–17.0)
MCH: 29.3 pg (ref 26.0–34.0)
MCHC: 34.2 g/dL (ref 30.0–36.0)
MCV: 85.5 fL (ref 80.0–100.0)
Platelets: 266 10*3/uL (ref 150–400)
RBC: 4.41 MIL/uL (ref 4.22–5.81)
RDW: 13.8 % (ref 11.5–15.5)
WBC: 9.6 10*3/uL (ref 4.0–10.5)
nRBC: 0 % (ref 0.0–0.2)

## 2022-02-14 LAB — BASIC METABOLIC PANEL
Anion gap: 8 (ref 5–15)
BUN: 17 mg/dL (ref 8–23)
CO2: 22 mmol/L (ref 22–32)
Calcium: 9.1 mg/dL (ref 8.9–10.3)
Chloride: 105 mmol/L (ref 98–111)
Creatinine, Ser: 1.02 mg/dL (ref 0.61–1.24)
GFR, Estimated: >60 mL/min (ref >60)
Glucose, Bld: 115 mg/dL — ABNORMAL HIGH (ref 70–99)
Potassium: 4.2 mmol/L (ref 3.5–5.1)
Sodium: 135 mmol/L (ref 135–145)

## 2022-02-14 LAB — TROPONIN I (HIGH SENSITIVITY)
Troponin I (High Sensitivity): 1818 ng/L (ref <18)
Troponin I (High Sensitivity): 1527 ng/L (ref <18)

## 2022-02-14 LAB — HIV ANTIBODY (ROUTINE TESTING W REFLEX): HIV Screen 4th Generation wRfx: NONREACTIVE

## 2022-02-14 LAB — HEPARIN LEVEL (UNFRACTIONATED): Heparin Unfractionated: 0.52 IU/mL (ref 0.30–0.70)

## 2022-02-14 MED ORDER — SODIUM CHLORIDE 0.9% FLUSH: 3.0000 mL | INTRAVENOUS | Status: DC | PRN

## 2022-02-14 MED ORDER — SODIUM CHLORIDE 0.9 % WEIGHT BASED INFUSION
3.0000 mL/kg/h | INTRAVENOUS | Status: DC
  Administered 2022-02-15: 3 mL/kg/h via INTRAVENOUS

## 2022-02-14 MED ORDER — SODIUM CHLORIDE 0.9 % IV SOLN: 250.0000 mL | INTRAVENOUS | Status: DC | PRN

## 2022-02-14 MED ORDER — ROSUVASTATIN CALCIUM 20 MG PO TABS
20.0000 mg | ORAL_TABLET | Freq: Every day | ORAL | Status: DC
  Administered 2022-02-14 – 2022-02-16 (×3): 20 mg via ORAL
  Filled 2022-02-14 (×3): qty 1

## 2022-02-14 MED ORDER — ASPIRIN 81 MG PO CHEW
81.0000 mg | CHEWABLE_TABLET | ORAL | Status: AC
  Administered 2022-02-15: 81 mg via ORAL
  Filled 2022-02-14: qty 1

## 2022-02-14 MED ORDER — SODIUM CHLORIDE 0.9 % WEIGHT BASED INFUSION: 1.0000 mL/kg/h | INTRAVENOUS | Status: DC

## 2022-02-14 MED ORDER — SODIUM CHLORIDE 0.9% FLUSH
3.0000 mL | Freq: Two times a day (BID) | INTRAVENOUS | Status: DC
  Administered 2022-02-14: 3 mL via INTRAVENOUS

## 2022-02-14 NOTE — Progress Notes (Signed)
Progress Note  Patient Name: Peter LINKENHOKER Date of Encounter: 02/14/2022  Primary Cardiologist: None  Subjective   Chest pain-free in the last 24 hours.  On heparin drip.  Denies any symptoms of SOB, chest pain.  No acute events overnight.  Inpatient Medications    Scheduled Meds:  carvedilol  6.25 mg Oral BID WC   pantoprazole  40 mg Oral Daily   Continuous Infusions:  heparin 1,550 Units/hr (02/14/22 0642)   PRN Meds: acetaminophen, HYDROmorphone (DILAUDID) injection, melatonin, nitroGLYCERIN, polyethylene glycol, prochlorperazine   Vital Signs    Vitals:   02/13/22 1957 02/13/22 2331 02/14/22 0416 02/14/22 0739  BP: 139/76 126/65 114/60 124/71  Pulse: 75 79 77 84  Resp: 18 18 18 19  $ Temp: 97.9 F (36.6 C) 97.9 F (36.6 C) 98.1 F (36.7 C) 98 F (36.7 C)  TempSrc: Oral Oral Oral Oral  SpO2: 96% 96% 97% 99%  Weight:        Intake/Output Summary (Last 24 hours) at 02/14/2022 0749 Last data filed at 02/14/2022 0317 Gross per 24 hour  Intake 1381.46 ml  Output 500 ml  Net 881.46 ml   Filed Weights   02/12/22 2056  Weight: 96.6 kg    Telemetry     Personally reviewed, NSR  ECG    NSR with no ST-T changes  Physical Exam   GEN: No acute distress.   Neck: No JVD. Cardiac: RRR, no murmur, rub, or gallop.  Respiratory: Nonlabored. Clear to auscultation bilaterally. GI: Soft, nontender, bowel sounds present. MS: No edema; No deformity. Neuro:  Nonfocal. Psych: Alert and oriented x 3. Normal affect.  Labs    Chemistry Recent Labs  Lab 02/12/22 2040  NA 141  K 3.8  CL 108  CO2 23  GLUCOSE 140*  BUN 18  CREATININE 1.21  CALCIUM 8.9  GFRNONAA >60  ANIONGAP 10     Hematology Recent Labs  Lab 02/12/22 2040 02/14/22 0216  WBC 7.9 9.6  RBC 4.74 4.41  HGB 13.5 12.9*  HCT 42.2 37.7*  MCV 89.0 85.5  MCH 28.5 29.3  MCHC 32.0 34.2  RDW 13.6 13.8  PLT 285 266    Cardiac Enzymes Recent Labs  Lab 02/12/22 2040  TROPONINIHS 1,061*     BNPNo results for input(s): "BNP", "PROBNP" in the last 168 hours.   DDimerNo results for input(s): "DDIMER" in the last 168 hours.   Radiology    ECHOCARDIOGRAM COMPLETE  Result Date: 02/13/2022    ECHOCARDIOGRAM REPORT   Patient Name:   FRANCISCA Williams Date of Exam: 02/13/2022 Medical Rec #:  [id number]      Height:       70.0 in Accession #:    [id number]     Weight:       213.0 lb Date of Birth:  [birth year]      BSA:          2.144 m Patient Age:    19 years       BP:           122/65 mmHg Patient Gender: M              HR:           72 bpm. Exam Location:  Inpatient Procedure: 2D Echo, Cardiac Doppler, Color Doppler and Intracardiac            Opacification Agent Indications:    NSTEMI I21.4  History:        Patient has  prior history of Echocardiogram examinations.                 Previous Myocardial Infarction; Risk Factors:Hypertension and                 Former Smoker.  Sonographer:    Wilkie Aye RVT RCS Referring Phys: [id number] [place]  Sonographer Comments: Technically challenging study due to limited acoustic windows and suboptimal parasternal window. IMPRESSIONS  1. Left ventricular ejection fraction, by estimation, is 60 to 65%. The left ventricle has normal function. The left ventricle has no regional wall motion abnormalities. There is mild left ventricular hypertrophy. Left ventricular diastolic parameters were normal.  2. Right ventricular systolic function is normal. The right ventricular size is normal. Tricuspid regurgitation signal is inadequate for assessing PA pressure.  3. The mitral valve is normal in structure. No evidence of mitral valve regurgitation. No evidence of mitral stenosis.  4. The aortic valve was not well visualized. Aortic valve regurgitation is not visualized. No aortic stenosis is present.  5. The inferior vena cava is normal in size with greater than 50% respiratory variability, suggesting right atrial pressure of 3 mmHg. Comparison(s): No prior  Echocardiogram. FINDINGS  Left Ventricle: Left ventricular ejection fraction, by estimation, is 60 to 65%. The left ventricle has normal function. The left ventricle has no regional wall motion abnormalities. Definity contrast agent was given IV to delineate the left ventricular  endocardial borders. The left ventricular internal cavity size was normal in size. There is mild left ventricular hypertrophy. Left ventricular diastolic parameters were normal. Right Ventricle: The right ventricular size is normal. No increase in right ventricular wall thickness. Right ventricular systolic function is normal. Tricuspid regurgitation signal is inadequate for assessing PA pressure. Left Atrium: Left atrial size was normal in size. Right Atrium: Right atrial size was normal in size. Pericardium: There is no evidence of pericardial effusion. Mitral Valve: The mitral valve is normal in structure. No evidence of mitral valve regurgitation. No evidence of mitral valve stenosis. Tricuspid Valve: The tricuspid valve is not well visualized. Tricuspid valve regurgitation is not demonstrated. No evidence of tricuspid stenosis. Aortic Valve: The aortic valve was not well visualized. Aortic valve regurgitation is not visualized. No aortic stenosis is present. Aortic valve mean gradient measures 2.0 mmHg. Aortic valve peak gradient measures 5.2 mmHg. Aortic valve area, by VTI measures 2.07 cm. Pulmonic Valve: The pulmonic valve was not well visualized. Pulmonic valve regurgitation is not visualized. No evidence of pulmonic stenosis. Aorta: The aortic root and ascending aorta are structurally normal, with no evidence of dilitation. Venous: The inferior vena cava is normal in size with greater than 50% respiratory variability, suggesting right atrial pressure of 3 mmHg. IAS/Shunts: No atrial level shunt detected by color flow Doppler.  LEFT VENTRICLE PLAX 2D LVIDd:         4.20 cm   Diastology LVIDs:         2.80 cm   LV e' medial:     7.22 cm/s LV PW:         1.20 cm   LV E/e' medial:  10.4 LV IVS:        1.30 cm   LV e' lateral:   8.51 cm/s LVOT diam:     2.10 cm   LV E/e' lateral: 8.8 LV SV:         44 LV SV Index:   21 LVOT Area:     3.46 cm  RIGHT VENTRICLE  IVC RV Basal diam:  3.20 cm     IVC diam: 1.90 cm RV S prime:     14.50 cm/s TAPSE (M-mode): 2.7 cm LEFT ATRIUM             Index        RIGHT ATRIUM           Index LA diam:        4.00 cm 1.87 cm/m   RA Area:     11.20 cm LA Vol (A2C):   46.3 ml 21.60 ml/m  RA Volume:   22.30 ml  10.40 ml/m LA Vol (A4C):   35.8 ml 16.70 ml/m LA Biplane Vol: 42.2 ml 19.68 ml/m  AORTIC VALVE                    PULMONIC VALVE AV Area (Vmax):    1.52 cm     PV Vmax:       0.88 m/s AV Area (Vmean):   1.70 cm     PV Peak grad:  3.1 mmHg AV Area (VTI):     2.07 cm AV Vmax:           114.00 cm/s AV Vmean:          71.400 cm/s AV VTI:            0.213 m AV Peak Grad:      5.2 mmHg AV Mean Grad:      2.0 mmHg LVOT Vmax:         49.90 cm/s LVOT Vmean:        35.000 cm/s LVOT VTI:          0.127 m LVOT/AV VTI ratio: 0.60  AORTA Ao Root diam: 2.90 cm Ao Asc diam:  3.10 cm Ao Arch diam: 2.8 cm MITRAL VALVE MV Area (PHT): 4.26 cm    SHUNTS MV Decel Time: 178 msec    Systemic VTI:  0.13 m MV E velocity: 75.00 cm/s  Systemic Diam: 2.10 cm MV A velocity: 59.50 cm/s MV E/A ratio:  1.26 Ashlyne Olenick Priya Demetria Iwai Electronically signed by Lorelee Cover Uvaldo Rybacki Signature Date/Time: 02/13/2022/6:57:55 PM    Final    DG Chest Port 1 View  Result Date: 02/12/2022 CLINICAL DATA:  Chest pain. EXAM: PORTABLE CHEST 1 VIEW COMPARISON:  Chest radiograph dated 01/02/2008. FINDINGS: No focal consolidation, pleural effusion, or pneumothorax. The cardiac silhouette is within limits. Atherosclerotic calcification of the aortic arch. No acute osseous pathology. IMPRESSION: No active disease. Electronically Signed   By: Anner Crete M.D.   On: 02/12/2022 21:57    Cardiac Studies   Echocardiogram from 02/13/2022  showed normal LVEF 60 to 65% with no valve abnormalities  Assessment & Plan    # NSTEMI (EKG negative for ischemia, Hs troponin 1000s) -Continue ACS protocol with aspirin, high intensity statin and heparin drip -Patient is posted for [facility] on Monday. No contrast allergy. Normal serum creatinine. Risks and benefits of cardiac catheterization have been discussed with the patient. These include bleeding, infection, kidney damage, stroke, heart attack, death. The patient understands these risks and is willing to proceed.  -Echocardiogram from 02/13/2022 showed normal LVEF 60 to 65% with no valve abnormalities. Lipid panel from admission showed LDL cholesterol 116.  # HTN, controlled -Continue carvedilol 6.25 mg twice daily  I have spent a total of 33 minutes with patient reviewing chart , telemetry, EKGs, labs and examining patient as well as establishing an assessment and plan that was discussed  with the patient.  > 50% of time was spent in direct patient care.      Signed, Chalmers Guest, MD  02/14/2022, 7:49 AM

## 2022-02-14 NOTE — Progress Notes (Signed)
PROGRESS NOTE    Peter Williams  [id number] DOB: March 10, 1955 DOA: 02/12/2022 PCP: Sharilyn Sites, MD    Brief Narrative:  Peter Williams is a 67 y.o. male with medical history significant for GERD, who presented to [facility] ED due to exertional chest pain.  He describes his chest pain as heavy pressure like on the central part of his chest, starting from his left arm and radiating to the central part of his chest.  Associated with shortness of breath.  Worse with ambulation and improved at rest.  No prior history of heart disease.  No similar prior symptoms.  Endorses a family history of heart disease in his mother.  No use of tobacco alcohol or illicit drugs.  He took a baby aspirin around 4 PM and again around 9 PM.  His wife drove him to the hospital for further evaluation.    Assessment and Plan: NSTEMI Presented with chest pain, pressure-like, starting from his left arm and radiating to the central part of his chest.  Associated with dyspnea.  Worse with ambulation and improved at rest. -plan for LHC in AM -heparin gtt.   GERD Resume home PPI  Obesity Estimated body mass index is 30.56 kg/m as calculated from the following:   Height as of 06/29/21: 5' 10"$  (1.778 m).   Weight as of this encounter: 96.6 kg.    QTc prolongation Twelve-lead EKG QTc 495 Avoid QTc prolonging agents Optimize magnesium and potassium levels  DVT prophylaxis: heparin gtt    Code Status: Full Code   Disposition Plan:  Level of care: Progressive Status is: Inpatient Remains inpatient appropriate because: needs     Consultants:  cards   Subjective: No further chest pain  Objective: Vitals:   02/14/22 0416 02/14/22 0739 02/14/22 0928 02/14/22 0949  BP: 114/60 124/71 (!) 158/94 135/73  Pulse: 77 84 85 81  Resp: 18 19    Temp: 98.1 F (36.7 C) 98 F (36.7 C)  97.6 F (36.4 C)  TempSrc: Oral Oral  Oral  SpO2: 97% 99%  98%  Weight:        Intake/Output Summary (Last 24 hours) at  02/14/2022 1123 Last data filed at 02/14/2022 [id number] Gross per 24 hour  Intake 1381.46 ml  Output --  Net 1381.46 ml   Filed Weights   02/12/22 2056  Weight: 96.6 kg    Examination:   General: Appearance:    Obese male in no acute distress     Lungs:     respirations unlabored  Heart:    Normal heart rate. Normal rhythm. No murmurs, rubs, or gallops.    MS:   All extremities are intact.    Neurologic:   Awake, alert       Data Reviewed: I have personally reviewed following labs and imaging studies  CBC: Recent Labs  Lab 02/12/22 2040 02/14/22 0216  WBC 7.9 9.6  HGB 13.5 12.9*  HCT 42.2 37.7*  MCV 89.0 85.5  PLT 285 [id number]   Basic Metabolic Panel: Recent Labs  Lab 02/12/22 2040 02/14/22 0807  NA 141 135  K 3.8 4.2  CL 108 105  CO2 23 22  GLUCOSE 140* 115*  BUN 18 17  CREATININE 1.21 1.02  CALCIUM 8.9 9.1   GFR: CrCl cannot be calculated (Unknown ideal weight.). Liver Function Tests: No results for input(s): "AST", "ALT", "ALKPHOS", "BILITOT", "PROT", "ALBUMIN" in the last 168 hours. No results for input(s): "LIPASE", "AMYLASE" in the last 168 hours. No results for  input(s): "AMMONIA" in the last 168 hours. Coagulation Profile: Recent Labs  Lab 02/12/22 2240  INR 1.0   Cardiac Enzymes: No results for input(s): "CKTOTAL", "CKMB", "CKMBINDEX", "TROPONINI" in the last 168 hours. BNP (last 3 results) No results for input(s): "PROBNP" in the last 8760 hours. HbA1C: Recent Labs    02/13/22 0452  HGBA1C 5.9*   CBG: Recent Labs  Lab 02/13/22 0208 02/13/22 0822 02/13/22 1226  GLUCAP 124* 96 158*   Lipid Profile: Recent Labs    02/13/22 0452  CHOL 187  HDL 53  LDLCALC 116*  TRIG 92  CHOLHDL 3.5   Thyroid Function Tests: No results for input(s): "TSH", "T4TOTAL", "FREET4", "T3FREE", "THYROIDAB" in the last 72 hours. Anemia Panel: No results for input(s): "VITAMINB12", "FOLATE", "FERRITIN", "TIBC", "IRON", "RETICCTPCT" in the last 72  hours. Sepsis Labs: No results for input(s): "PROCALCITON", "LATICACIDVEN" in the last 168 hours.  No results found for this or any previous visit (from the past 240 hour(s)).       Radiology Studies: ECHOCARDIOGRAM COMPLETE  Result Date: 02/13/2022    ECHOCARDIOGRAM REPORT   Patient Name:   Peter Williams Date of Exam: 02/13/2022 Medical Rec #:  [id number]      Height:       70.0 in Accession #:    [id number]     Weight:       213.0 lb Date of Birth:  December 08, 1955      BSA:          2.144 m Patient Age:    27 years       BP:           122/65 mmHg Patient Gender: M              HR:           72 bpm. Exam Location:  Inpatient Procedure: 2D Echo, Cardiac Doppler, Color Doppler and Intracardiac            Opacification Agent Indications:    NSTEMI I21.4  History:        Patient has prior history of Echocardiogram examinations.                 Previous Myocardial Infarction; Risk Factors:Hypertension and                 Former Smoker.  Sonographer:    Wilkie Aye RVT RCS Referring Phys: [id number] [place]  Sonographer Comments: Technically challenging study due to limited acoustic windows and suboptimal parasternal window. IMPRESSIONS  1. Left ventricular ejection fraction, by estimation, is 60 to 65%. The left ventricle has normal function. The left ventricle has no regional wall motion abnormalities. There is mild left ventricular hypertrophy. Left ventricular diastolic parameters were normal.  2. Right ventricular systolic function is normal. The right ventricular size is normal. Tricuspid regurgitation signal is inadequate for assessing PA pressure.  3. The mitral valve is normal in structure. No evidence of mitral valve regurgitation. No evidence of mitral stenosis.  4. The aortic valve was not well visualized. Aortic valve regurgitation is not visualized. No aortic stenosis is present.  5. The inferior vena cava is normal in size with greater than 50% respiratory variability, suggesting right atrial  pressure of 3 mmHg. Comparison(s): No prior Echocardiogram. FINDINGS  Left Ventricle: Left ventricular ejection fraction, by estimation, is 60 to 65%. The left ventricle has normal function. The left ventricle has no regional wall motion abnormalities. Definity contrast agent was given IV  to delineate the left ventricular  endocardial borders. The left ventricular internal cavity size was normal in size. There is mild left ventricular hypertrophy. Left ventricular diastolic parameters were normal. Right Ventricle: The right ventricular size is normal. No increase in right ventricular wall thickness. Right ventricular systolic function is normal. Tricuspid regurgitation signal is inadequate for assessing PA pressure. Left Atrium: Left atrial size was normal in size. Right Atrium: Right atrial size was normal in size. Pericardium: There is no evidence of pericardial effusion. Mitral Valve: The mitral valve is normal in structure. No evidence of mitral valve regurgitation. No evidence of mitral valve stenosis. Tricuspid Valve: The tricuspid valve is not well visualized. Tricuspid valve regurgitation is not demonstrated. No evidence of tricuspid stenosis. Aortic Valve: The aortic valve was not well visualized. Aortic valve regurgitation is not visualized. No aortic stenosis is present. Aortic valve mean gradient measures 2.0 mmHg. Aortic valve peak gradient measures 5.2 mmHg. Aortic valve area, by VTI measures 2.07 cm. Pulmonic Valve: The pulmonic valve was not well visualized. Pulmonic valve regurgitation is not visualized. No evidence of pulmonic stenosis. Aorta: The aortic root and ascending aorta are structurally normal, with no evidence of dilitation. Venous: The inferior vena cava is normal in size with greater than 50% respiratory variability, suggesting right atrial pressure of 3 mmHg. IAS/Shunts: No atrial level shunt detected by color flow Doppler.  LEFT VENTRICLE PLAX 2D LVIDd:         4.20 cm   Diastology  LVIDs:         2.80 cm   LV e' medial:    7.22 cm/s LV PW:         1.20 cm   LV E/e' medial:  10.4 LV IVS:        1.30 cm   LV e' lateral:   8.51 cm/s LVOT diam:     2.10 cm   LV E/e' lateral: 8.8 LV SV:         44 LV SV Index:   21 LVOT Area:     3.46 cm  RIGHT VENTRICLE             IVC RV Basal diam:  3.20 cm     IVC diam: 1.90 cm RV S prime:     14.50 cm/s TAPSE (M-mode): 2.7 cm LEFT ATRIUM             Index        RIGHT ATRIUM           Index LA diam:        4.00 cm 1.87 cm/m   RA Area:     11.20 cm LA Vol (A2C):   46.3 ml 21.60 ml/m  RA Volume:   22.30 ml  10.40 ml/m LA Vol (A4C):   35.8 ml 16.70 ml/m LA Biplane Vol: 42.2 ml 19.68 ml/m  AORTIC VALVE                    PULMONIC VALVE AV Area (Vmax):    1.52 cm     PV Vmax:       0.88 m/s AV Area (Vmean):   1.70 cm     PV Peak grad:  3.1 mmHg AV Area (VTI):     2.07 cm AV Vmax:           114.00 cm/s AV Vmean:          71.400 cm/s AV VTI:  0.213 m AV Peak Grad:      5.2 mmHg AV Mean Grad:      2.0 mmHg LVOT Vmax:         49.90 cm/s LVOT Vmean:        35.000 cm/s LVOT VTI:          0.127 m LVOT/AV VTI ratio: 0.60  AORTA Ao Root diam: 2.90 cm Ao Asc diam:  3.10 cm Ao Arch diam: 2.8 cm MITRAL VALVE MV Area (PHT): 4.26 cm    SHUNTS MV Decel Time: 178 msec    Systemic VTI:  0.13 m MV E velocity: 75.00 cm/s  Systemic Diam: 2.10 cm MV A velocity: 59.50 cm/s MV E/A ratio:  1.26 Vishnu Priya Mallipeddi Electronically signed by Lorelee Cover Mallipeddi Signature Date/Time: 02/13/2022/6:57:55 PM    Final    DG Chest Port 1 View  Result Date: 02/12/2022 CLINICAL DATA:  Chest pain. EXAM: PORTABLE CHEST 1 VIEW COMPARISON:  Chest radiograph dated 01/02/2008. FINDINGS: No focal consolidation, pleural effusion, or pneumothorax. The cardiac silhouette is within limits. Atherosclerotic calcification of the aortic arch. No acute osseous pathology. IMPRESSION: No active disease. Electronically Signed   By: Anner Crete M.D.   On: 02/12/2022 21:57         Scheduled Meds:  carvedilol  6.25 mg Oral BID WC   pantoprazole  40 mg Oral Daily   rosuvastatin  20 mg Oral Daily   Continuous Infusions:  heparin 1,550 Units/hr (02/14/22 0642)     LOS: 2 days    Time spent: 45 minutes spent on chart review, discussion with nursing staff, consultants, updating family and interview/physical exam; more than 50% of that time was spent in counseling and/or coordination of care.    Geradine Girt, DO [facility] Available via Epic secure chat 7am-7pm After these hours, please refer to coverage provider listed on [email or web address] 02/14/2022, 11:23 AM

## 2022-02-14 NOTE — Progress Notes (Signed)
ANTICOAGULATION CONSULT NOTE   Pharmacy Consult for heparin Indication: chest pain/ACS  No Known Allergies  Patient Measurements: Weight: 96.6 kg (213 lb) Heparin Dosing Weight: 96.6kg  Vital Signs: Temp: 98 F (36.7 C) (02/11 0739) Temp Source: Oral (02/11 0739) BP: 124/71 (02/11 0739) Pulse Rate: 84 (02/11 0739)  Labs: Recent Labs    02/12/22 2040 02/12/22 2240 02/13/22 0452 02/13/22 1424 02/13/22 2140 02/14/22 0216  HGB 13.5  --   --   --   --  12.9*  HCT 42.2  --   --   --   --  37.7*  PLT 285  --   --   --   --  266  APTT  --  24  --   --   --   --   LABPROT  --  12.7  --   --   --   --   INR  --  1.0  --   --   --   --   HEPARINUNFRC  --   --    < > 0.27* 0.46 0.52  CREATININE 1.21  --   --   --   --   --   TROPONINIHS 1,061*  --   --   --   --   --    < > = values in this interval not displayed.     CrCl cannot be calculated (Unknown ideal weight.).   Medical History: Past Medical History:  Diagnosis Date   Bruit of right carotid artery    patient unaware   DDD (degenerative disc disease), lumbosacral    Gastritis    GERD (gastroesophageal reflux disease)    Peptic ulcer disease      Assessment: 67 yo male with intermittent chest pain that started 2/9, feels like pressure, radiates into the L arm.  Pharmacy consulted to dose heparin drip for NSTEMI.  No prior AC noted  HL 0.52 which is therapeutic. CBC WNL.  No issues with infusion or bleeding noted.   Goal of Therapy:  Heparin level 0.3-0.7 units/ml Monitor platelets by anticoagulation protocol: Yes   Plan:  Continue heparin at 1550 units/hr Check daily heparin level/CBC Monitor for s/sx of bleeding  Francena Hanly, PharmD Pharmacy Resident  02/14/2022 8:10 AM

## 2022-02-15 ENCOUNTER — Other Ambulatory Visit (HOSPITAL_COMMUNITY): Payer: Self-pay

## 2022-02-15 ENCOUNTER — Encounter (HOSPITAL_COMMUNITY)
Admission: EM | Disposition: A | Discharge status: Home / Self Care | Payer: Self-pay | Source: Home / Self Care | Attending: Internal Medicine

## 2022-02-15 DIAGNOSIS — I2510 Atherosclerotic heart disease of native coronary artery without angina pectoris: Secondary | ICD-10-CM

## 2022-02-15 DIAGNOSIS — E785 Hyperlipidemia, unspecified: Secondary | ICD-10-CM

## 2022-02-15 DIAGNOSIS — I214 Non-ST elevation (NSTEMI) myocardial infarction: Secondary | ICD-10-CM | POA: Diagnosis not present

## 2022-02-15 HISTORY — PX: CORONARY STENT INTERVENTION: CATH118234

## 2022-02-15 HISTORY — PX: LEFT HEART CATH AND CORONARY ANGIOGRAPHY: CATH118249

## 2022-02-15 HISTORY — PX: CORONARY BALLOON ANGIOPLASTY: CATH118233

## 2022-02-15 LAB — BASIC METABOLIC PANEL
Anion gap: 10 (ref 5–15)
BUN: 19 mg/dL (ref 8–23)
CO2: 23 mmol/L (ref 22–32)
Calcium: 8.9 mg/dL (ref 8.9–10.3)
Chloride: 105 mmol/L (ref 98–111)
Creatinine, Ser: 1.1 mg/dL (ref 0.61–1.24)
GFR, Estimated: >60 mL/min (ref >60)
Glucose, Bld: 109 mg/dL — ABNORMAL HIGH (ref 70–99)
Potassium: 3.9 mmol/L (ref 3.5–5.1)
Sodium: 138 mmol/L (ref 135–145)

## 2022-02-15 LAB — POCT ACTIVATED CLOTTING TIME
Activated Clotting Time: 174 seconds
Activated Clotting Time: 250 seconds
Activated Clotting Time: 277 seconds

## 2022-02-15 LAB — CBC
HCT: 37.2 % — ABNORMAL LOW (ref 39.0–52.0)
Hemoglobin: 12.7 g/dL — ABNORMAL LOW (ref 13.0–17.0)
MCH: 29.3 pg (ref 26.0–34.0)
MCHC: 34.1 g/dL (ref 30.0–36.0)
MCV: 85.7 fL (ref 80.0–100.0)
Platelets: 255 10*3/uL (ref 150–400)
RBC: 4.34 MIL/uL (ref 4.22–5.81)
RDW: 13.7 % (ref 11.5–15.5)
WBC: 8.5 10*3/uL (ref 4.0–10.5)
nRBC: 0 % (ref 0.0–0.2)

## 2022-02-15 LAB — HEPARIN LEVEL (UNFRACTIONATED): Heparin Unfractionated: 0.5 IU/mL (ref 0.30–0.70)

## 2022-02-15 SURGERY — LEFT HEART CATH AND CORONARY ANGIOGRAPHY
Anesthesia: LOCAL

## 2022-02-15 MED ORDER — MIDAZOLAM HCL 2 MG/2ML IJ SOLN
INTRAMUSCULAR | Status: DC | PRN
  Administered 2022-02-15: 2 mg via INTRAVENOUS
  Administered 2022-02-15: 1 mg via INTRAVENOUS

## 2022-02-15 MED ORDER — TICAGRELOR 90 MG PO TABS
90.0000 mg | ORAL_TABLET | Freq: Two times a day (BID) | ORAL | Status: DC
  Administered 2022-02-16: 90 mg via ORAL
  Filled 2022-02-15: qty 1

## 2022-02-15 MED ORDER — HEPARIN SODIUM (PORCINE) 1000 UNIT/ML IJ SOLN
INTRAMUSCULAR | Status: AC
  Filled 2022-02-15: qty 10

## 2022-02-15 MED ORDER — SODIUM CHLORIDE 0.9 % IV SOLN: INTRAVENOUS | Status: AC

## 2022-02-15 MED ORDER — ASPIRIN 81 MG PO TBEC
81.0000 mg | DELAYED_RELEASE_TABLET | Freq: Every day | ORAL | Status: DC
  Administered 2022-02-16: 81 mg via ORAL
  Filled 2022-02-15: qty 1

## 2022-02-15 MED ORDER — ASPIRIN 81 MG PO CHEW: 81.0000 mg | CHEWABLE_TABLET | Freq: Every day | ORAL | Status: DC

## 2022-02-15 MED ORDER — IOHEXOL 350 MG/ML SOLN
INTRAVENOUS | Status: DC | PRN
  Administered 2022-02-15: 95 mL

## 2022-02-15 MED ORDER — LIDOCAINE HCL (PF) 1 % IJ SOLN
INTRAMUSCULAR | Status: AC
  Filled 2022-02-15: qty 30

## 2022-02-15 MED ORDER — VERAPAMIL HCL 2.5 MG/ML IV SOLN
INTRAVENOUS | Status: AC
  Filled 2022-02-15: qty 2

## 2022-02-15 MED ORDER — HEPARIN (PORCINE) IN NACL 1000-0.9 UT/500ML-% IV SOLN
INTRAVENOUS | Status: AC
  Filled 2022-02-15: qty 500

## 2022-02-15 MED ORDER — FENTANYL CITRATE (PF) 100 MCG/2ML IJ SOLN
INTRAMUSCULAR | Status: DC | PRN
  Administered 2022-02-15 (×2): 25 ug via INTRAVENOUS

## 2022-02-15 MED ORDER — MIDAZOLAM HCL 2 MG/2ML IJ SOLN
INTRAMUSCULAR | Status: AC
  Filled 2022-02-15: qty 2

## 2022-02-15 MED ORDER — SODIUM CHLORIDE 0.9% FLUSH: 3.0000 mL | Freq: Two times a day (BID) | INTRAVENOUS | Status: DC

## 2022-02-15 MED ORDER — SODIUM CHLORIDE 0.9% FLUSH: 3.0000 mL | INTRAVENOUS | Status: DC | PRN

## 2022-02-15 MED ORDER — LABETALOL HCL 5 MG/ML IV SOLN: 10.0000 mg | INTRAVENOUS | Status: AC | PRN

## 2022-02-15 MED ORDER — HEPARIN (PORCINE) IN NACL 1000-0.9 UT/500ML-% IV SOLN
INTRAVENOUS | Status: DC | PRN
  Administered 2022-02-15 (×2): 500 mL

## 2022-02-15 MED ORDER — SODIUM CHLORIDE 0.9 % IV SOLN: 250.0000 mL | INTRAVENOUS | Status: DC | PRN

## 2022-02-15 MED ORDER — LIDOCAINE HCL (PF) 1 % IJ SOLN
INTRAMUSCULAR | Status: DC | PRN
  Administered 2022-02-15: 2 mL

## 2022-02-15 MED ORDER — VERAPAMIL HCL 2.5 MG/ML IV SOLN
INTRAVENOUS | Status: DC | PRN
  Administered 2022-02-15: 10 mL via INTRA_ARTERIAL

## 2022-02-15 MED ORDER — ONDANSETRON HCL 4 MG/2ML IJ SOLN: 4.0000 mg | Freq: Four times a day (QID) | INTRAMUSCULAR | Status: DC | PRN

## 2022-02-15 MED ORDER — FENTANYL CITRATE (PF) 100 MCG/2ML IJ SOLN
INTRAMUSCULAR | Status: AC
  Filled 2022-02-15: qty 2

## 2022-02-15 MED ORDER — TICAGRELOR 90 MG PO TABS
ORAL_TABLET | ORAL | Status: AC
  Filled 2022-02-15: qty 2

## 2022-02-15 MED ORDER — ACETAMINOPHEN 325 MG PO TABS: 650.0000 mg | ORAL_TABLET | ORAL | Status: DC | PRN

## 2022-02-15 MED ORDER — HYDRALAZINE HCL 20 MG/ML IJ SOLN: 10.0000 mg | INTRAMUSCULAR | Status: AC | PRN

## 2022-02-15 MED ORDER — NITROGLYCERIN 1 MG/10 ML FOR IR/CATH LAB
INTRA_ARTERIAL | Status: DC | PRN
  Administered 2022-02-15: 400 ug via INTRA_ARTERIAL

## 2022-02-15 MED ORDER — ROSUVASTATIN CALCIUM 20 MG PO TABS: 20.0000 mg | ORAL_TABLET | Freq: Every day | ORAL | Status: DC

## 2022-02-15 MED ORDER — NITROGLYCERIN 1 MG/10 ML FOR IR/CATH LAB
INTRA_ARTERIAL | Status: AC
  Filled 2022-02-15: qty 10

## 2022-02-15 MED ORDER — TICAGRELOR 90 MG PO TABS
ORAL_TABLET | ORAL | Status: DC | PRN
  Administered 2022-02-15: 180 mg via ORAL

## 2022-02-15 MED ORDER — HEPARIN SODIUM (PORCINE) 1000 UNIT/ML IJ SOLN
INTRAMUSCULAR | Status: DC | PRN
  Administered 2022-02-15: 3000 [IU] via INTRAVENOUS
  Administered 2022-02-15: 2000 [IU] via INTRAVENOUS
  Administered 2022-02-15 (×2): 6000 [IU] via INTRAVENOUS
  Administered 2022-02-15: 5000 [IU] via INTRAVENOUS

## 2022-02-15 SURGICAL SUPPLY — 24 items
BALLN EUPHORA RX 2.25X15 (BALLOONS) ×1
BALLN SAPPHIRE 2.0X12 (BALLOONS) ×1
BALLN ~~LOC~~ EUPHORA RX 3.0X8 (BALLOONS) ×1
BALLOON EUPHORA RX 2.25X15 (BALLOONS) IMPLANT
BALLOON SAPPHIRE 2.0X12 (BALLOONS) IMPLANT
BALLOON ~~LOC~~ EUPHORA RX 3.0X8 (BALLOONS) IMPLANT
CATH INFINITI 5 FR JL3.5 (CATHETERS) IMPLANT
CATH INFINITI JR4 5F (CATHETERS) IMPLANT
CATH LAUNCHER 6FR EBU3.5 (CATHETERS) IMPLANT
DEVICE RAD COMP TR BAND LRG (VASCULAR PRODUCTS) IMPLANT
GLIDESHEATH SLEND SS 6F .021 (SHEATH) IMPLANT
GUIDEWIRE INQWIRE 1.5J.035X260 (WIRE) IMPLANT
INQWIRE 1.5J .035X260CM (WIRE) ×1
KIT ENCORE 26 ADVANTAGE (KITS) IMPLANT
KIT HEART LEFT (KITS) ×1 IMPLANT
PACK CARDIAC CATHETERIZATION (CUSTOM PROCEDURE TRAY) ×1 IMPLANT
SHEATH PROBE COVER 6X72 (BAG) IMPLANT
STENT SYNERGY XD 2.50X20 (Permanent Stent) IMPLANT
SYNERGY XD 2.50X20 (Permanent Stent) ×1 IMPLANT
TRANSDUCER W/STOPCOCK (MISCELLANEOUS) ×1 IMPLANT
TUBING CIL FLEX 10 FLL-RA (TUBING) ×1 IMPLANT
VALVE GUARDIAN II ~~LOC~~ HEMO (MISCELLANEOUS) IMPLANT
WIRE ASAHI PROWATER 180CM (WIRE) IMPLANT
WIRE RUNTHROUGH .014X180CM (WIRE) IMPLANT

## 2022-02-15 NOTE — H&P (View-Only) (Signed)
Rounding Note    Patient Name: Peter Williams Date of Encounter: 02/15/2022  [facility] Cardiologist: None   Subjective   No chest pain overnight. Planned for cardiac cath today  Inpatient Medications    Scheduled Meds:  carvedilol  6.25 mg Oral BID WC   pantoprazole  40 mg Oral Daily   rosuvastatin  20 mg Oral Daily   sodium chloride flush  3 mL Intravenous Q12H   Continuous Infusions:  sodium chloride     sodium chloride 1 mL/kg/hr (02/15/22 0600)   heparin 1,550 Units/hr (02/15/22 [id number])   PRN Meds: sodium chloride, acetaminophen, HYDROmorphone (DILAUDID) injection, melatonin, nitroGLYCERIN, polyethylene glycol, prochlorperazine, sodium chloride flush   Vital Signs    Vitals:   02/14/22 1256 02/14/22 1643 02/14/22 2004 02/15/22 0342  BP: 127/66 118/64 127/71 123/63  Pulse: 73 72 76 78  Resp: 18 16 17 18  $ Temp: 97.8 F (36.6 C) (!) 97.5 F (36.4 C) 97.6 F (36.4 C) 97.6 F (36.4 C)  TempSrc: Oral Oral Oral Oral  SpO2: 97% 96% 97% 96%  Weight:       No intake or output data in the 24 hours ending 02/15/22 0811    02/12/2022    8:56 PM 06/29/2021    1:14 PM 01/15/2019    2:51 PM  Last 3 Weights  Weight (lbs) 213 lb 209 lb 207 lb 9.6 oz  Weight (kg) 96.616 kg 94.802 kg 94.167 kg      Telemetry    Sinus Rhythm - Personally Reviewed  ECG    No new tracing this morning  Physical Exam   GEN: No acute distress.   Neck: No JVD Cardiac: RRR, no murmurs, rubs, or gallops.  Respiratory: Clear to auscultation bilaterally. GI: Soft, nontender, non-distended  MS: No edema; No deformity. Neuro:  Nonfocal  Psych: Normal affect   Labs    High Sensitivity Troponin:   Recent Labs  Lab 02/12/22 2040 02/14/22 0807 02/14/22 1102  TROPONINIHS 1,061* 1,818* 1,527*     Chemistry Recent Labs  Lab 02/12/22 2040 02/14/22 0807 02/15/22 0234  NA 141 135 138  K 3.8 4.2 3.9  CL 108 105 105  CO2 23 22 23  $ GLUCOSE 140* 115* 109*  BUN 18 17 19   $ CREATININE 1.21 1.02 1.10  CALCIUM 8.9 9.1 8.9  GFRNONAA >60 >60 >60  ANIONGAP 10 8 10    $ Lipids  Recent Labs  Lab 02/13/22 0452  CHOL 187  TRIG 92  HDL 53  LDLCALC 116*  CHOLHDL 3.5    Hematology Recent Labs  Lab 02/12/22 2040 02/14/22 0216 02/15/22 0234  WBC 7.9 9.6 8.5  RBC 4.74 4.41 4.34  HGB 13.5 12.9* 12.7*  HCT 42.2 37.7* 37.2*  MCV 89.0 85.5 85.7  MCH 28.5 29.3 29.3  MCHC 32.0 34.2 34.1  RDW 13.6 13.8 13.7  PLT 285 266 255   Thyroid No results for input(s): "TSH", "FREET4" in the last 168 hours.  BNPNo results for input(s): "BNP", "PROBNP" in the last 168 hours.  DDimer No results for input(s): "DDIMER" in the last 168 hours.   Radiology    ECHOCARDIOGRAM COMPLETE  Result Date: 02/13/2022    ECHOCARDIOGRAM REPORT   Patient Name:   Peter Williams Date of Exam: 02/13/2022 Medical Rec #:  [id number]      Height:       70.0 in Accession #:    [id number]     Weight:  213.0 lb Date of Birth:  1955-01-21      BSA:          2.144 m Patient Age:    67 years       BP:           122/65 mmHg Patient Gender: M              HR:           72 bpm. Exam Location:  Inpatient Procedure: 2D Echo, Cardiac Doppler, Color Doppler and Intracardiac            Opacification Agent Indications:    NSTEMI I21.4  History:        Patient has prior history of Echocardiogram examinations.                 Previous Myocardial Infarction; Risk Factors:Hypertension and                 Former Smoker.  Sonographer:    Wilkie Aye RVT RCS Referring Phys: [id number] [place]  Sonographer Comments: Technically challenging study due to limited acoustic windows and suboptimal parasternal window. IMPRESSIONS  1. Left ventricular ejection fraction, by estimation, is 60 to 65%. The left ventricle has normal function. The left ventricle has no regional wall motion abnormalities. There is mild left ventricular hypertrophy. Left ventricular diastolic parameters were normal.  2. Right ventricular systolic  function is normal. The right ventricular size is normal. Tricuspid regurgitation signal is inadequate for assessing PA pressure.  3. The mitral valve is normal in structure. No evidence of mitral valve regurgitation. No evidence of mitral stenosis.  4. The aortic valve was not well visualized. Aortic valve regurgitation is not visualized. No aortic stenosis is present.  5. The inferior vena cava is normal in size with greater than 50% respiratory variability, suggesting right atrial pressure of 3 mmHg. Comparison(s): No prior Echocardiogram. FINDINGS  Left Ventricle: Left ventricular ejection fraction, by estimation, is 60 to 65%. The left ventricle has normal function. The left ventricle has no regional wall motion abnormalities. Definity contrast agent was given IV to delineate the left ventricular  endocardial borders. The left ventricular internal cavity size was normal in size. There is mild left ventricular hypertrophy. Left ventricular diastolic parameters were normal. Right Ventricle: The right ventricular size is normal. No increase in right ventricular wall thickness. Right ventricular systolic function is normal. Tricuspid regurgitation signal is inadequate for assessing PA pressure. Left Atrium: Left atrial size was normal in size. Right Atrium: Right atrial size was normal in size. Pericardium: There is no evidence of pericardial effusion. Mitral Valve: The mitral valve is normal in structure. No evidence of mitral valve regurgitation. No evidence of mitral valve stenosis. Tricuspid Valve: The tricuspid valve is not well visualized. Tricuspid valve regurgitation is not demonstrated. No evidence of tricuspid stenosis. Aortic Valve: The aortic valve was not well visualized. Aortic valve regurgitation is not visualized. No aortic stenosis is present. Aortic valve mean gradient measures 2.0 mmHg. Aortic valve peak gradient measures 5.2 mmHg. Aortic valve area, by VTI measures 2.07 cm. Pulmonic Valve: The  pulmonic valve was not well visualized. Pulmonic valve regurgitation is not visualized. No evidence of pulmonic stenosis. Aorta: The aortic root and ascending aorta are structurally normal, with no evidence of dilitation. Venous: The inferior vena cava is normal in size with greater than 50% respiratory variability, suggesting right atrial pressure of 3 mmHg. IAS/Shunts: No atrial level shunt detected by color flow Doppler.  LEFT VENTRICLE PLAX 2D LVIDd:         4.20 cm   Diastology LVIDs:         2.80 cm   LV e' medial:    7.22 cm/s LV PW:         1.20 cm   LV E/e' medial:  10.4 LV IVS:        1.30 cm   LV e' lateral:   8.51 cm/s LVOT diam:     2.10 cm   LV E/e' lateral: 8.8 LV SV:         44 LV SV Index:   21 LVOT Area:     3.46 cm  RIGHT VENTRICLE             IVC RV Basal diam:  3.20 cm     IVC diam: 1.90 cm RV S prime:     14.50 cm/s TAPSE (M-mode): 2.7 cm LEFT ATRIUM             Index        RIGHT ATRIUM           Index LA diam:        4.00 cm 1.87 cm/m   RA Area:     11.20 cm LA Vol (A2C):   46.3 ml 21.60 ml/m  RA Volume:   22.30 ml  10.40 ml/m LA Vol (A4C):   35.8 ml 16.70 ml/m LA Biplane Vol: 42.2 ml 19.68 ml/m  AORTIC VALVE                    PULMONIC VALVE AV Area (Vmax):    1.52 cm     PV Vmax:       0.88 m/s AV Area (Vmean):   1.70 cm     PV Peak grad:  3.1 mmHg AV Area (VTI):     2.07 cm AV Vmax:           114.00 cm/s AV Vmean:          71.400 cm/s AV VTI:            0.213 m AV Peak Grad:      5.2 mmHg AV Mean Grad:      2.0 mmHg LVOT Vmax:         49.90 cm/s LVOT Vmean:        35.000 cm/s LVOT VTI:          0.127 m LVOT/AV VTI ratio: 0.60  AORTA Ao Root diam: 2.90 cm Ao Asc diam:  3.10 cm Ao Arch diam: 2.8 cm MITRAL VALVE MV Area (PHT): 4.26 cm    SHUNTS MV Decel Time: 178 msec    Systemic VTI:  0.13 m MV E velocity: 75.00 cm/s  Systemic Diam: 2.10 cm MV A velocity: 59.50 cm/s MV E/A ratio:  1.26 Vishnu Priya Mallipeddi Electronically signed by Lorelee Cover Mallipeddi Signature Date/Time:  02/13/2022/6:57:55 PM    Final     Cardiac Studies   Echo: 02/13/2022  IMPRESSIONS     1. Left ventricular ejection fraction, by estimation, is 60 to 65%. The  left ventricle has normal function. The left ventricle has no regional  wall motion abnormalities. There is mild left ventricular hypertrophy.  Left ventricular diastolic parameters  were normal.   2. Right ventricular systolic function is normal. The right ventricular  size is normal. Tricuspid regurgitation signal is inadequate for assessing  PA pressure.   3. The mitral valve is normal in  structure. No evidence of mitral valve  regurgitation. No evidence of mitral stenosis.   4. The aortic valve was not well visualized. Aortic valve regurgitation  is not visualized. No aortic stenosis is present.   5. The inferior vena cava is normal in size with greater than 50%  respiratory variability, suggesting right atrial pressure of 3 mmHg.   Comparison(s): No prior Echocardiogram.   FINDINGS   Left Ventricle: Left ventricular ejection fraction, by estimation, is 60  to 65%. The left ventricle has normal function. The left ventricle has no  regional wall motion abnormalities. Definity contrast agent was given IV  to delineate the left ventricular   endocardial borders. The left ventricular internal cavity size was normal  in size. There is mild left ventricular hypertrophy. Left ventricular  diastolic parameters were normal.   Right Ventricle: The right ventricular size is normal. No increase in  right ventricular wall thickness. Right ventricular systolic function is  normal. Tricuspid regurgitation signal is inadequate for assessing PA  pressure.   Left Atrium: Left atrial size was normal in size.   Right Atrium: Right atrial size was normal in size.   Pericardium: There is no evidence of pericardial effusion.   Mitral Valve: The mitral valve is normal in structure. No evidence of  mitral valve regurgitation. No  evidence of mitral valve stenosis.   Tricuspid Valve: The tricuspid valve is not well visualized. Tricuspid  valve regurgitation is not demonstrated. No evidence of tricuspid  stenosis.   Aortic Valve: The aortic valve was not well visualized. Aortic valve  regurgitation is not visualized. No aortic stenosis is present. Aortic  valve mean gradient measures 2.0 mmHg. Aortic valve peak gradient measures  5.2 mmHg. Aortic valve area, by VTI  measures 2.07 cm.   Pulmonic Valve: The pulmonic valve was not well visualized. Pulmonic valve  regurgitation is not visualized. No evidence of pulmonic stenosis.   Aorta: The aortic root and ascending aorta are structurally normal, with  no evidence of dilitation.   Venous: The inferior vena cava is normal in size with greater than 50%  respiratory variability, suggesting right atrial pressure of 3 mmHg.   IAS/Shunts: No atrial level shunt detected by color flow Doppler.    Patient Profile     67 y.o. male with PMH of GERD who presented with chest pain and found to have NSTEMI  Assessment & Plan    NSTEMI -- High-sensitivity troponin peaked at 1818.  Plan for cardiac catheterization today. Echo showed LVEF of 60 to 65%, no regional wall motion abnormality, mild LVH, normal RV size and function, no evidence of valvular disease. No chest pain overnight. -- Continue IV heparin, aspirin, Coreg 6.25 mg twice daily, statin  Hyperlipidemia -- LDL 116, HDL 53. Check LP(a) -- Continue Crestor 20 mg daily, increased from 66m daily on admission  Hypertension -- Well-controlled continue Coreg 6.25 mg twice daily  GERD -- PPI  For questions or updates, please contact [place] consult [email or web address] for contact info under        Signed, LReino Bellis NP  02/15/2022, 8:11 AM    Patient seen, examined. Available data reviewed. Agree with findings, assessment, and plan as outlined by LReino Bellis NP.  The patient is  independently interviewed and examined.  He is alert, oriented, no distress.  HEENT is normal, JVP is normal, carotid upstrokes normal without bruits, lungs are clear bilaterally, heart is regular rate rhythm no murmur gallop, abdomen soft nontender, extremities have  no edema, right radial pulses 2+.  Notes reviewed, imaging data and labs reviewed.  The patient has had no recurrent chest discomfort over the last 24 hours.  He has had a non-STEMI and cardiac catheterization is clearly indicated. I have reviewed the risks, indications, and alternatives to cardiac catheterization, possible angioplasty, and stenting with the patient. Risks include but are not limited to bleeding, infection, vascular injury, stroke, myocardial infection, arrhythmia, kidney injury, radiation-related injury in the case of prolonged fluoroscopy use, emergency cardiac surgery, and death. The patient understands the risks of serious complication is 1-2 in [id number] with diagnostic cardiac cath and 1-2% or less with angioplasty/stenting.   Further plans pending cardiac cath results.  He remains on aspirin, rosuvastatin, carvedilol, and IV heparin.  Sherren Mocha, M.D. 02/15/2022 10:24 AM

## 2022-02-15 NOTE — Interval H&P Note (Signed)
Cath Lab Visit (complete for each Cath Lab visit)  Clinical Evaluation Leading to the Procedure:   ACS: Yes.    Non-ACS:    Anginal Classification: CCS IV  Anti-ischemic medical therapy: Minimal Therapy (1 class of medications)  Non-Invasive Test Results: No non-invasive testing performed  Prior CABG: No previous CABG      History and Physical Interval Note:  02/15/2022 12:54 PM  Peter Williams  has presented today for surgery, with the diagnosis of NSTEMI.  The various methods of treatment have been discussed with the patient and family. After consideration of risks, benefits and other options for treatment, the patient has consented to  Procedure(s): LEFT HEART CATH AND CORONARY ANGIOGRAPHY (N/A) as a surgical intervention.  The patient's history has been reviewed, patient examined, no change in status, stable for surgery.  I have reviewed the patient's chart and labs.  Questions were answered to the patient's satisfaction.     Larae Grooms

## 2022-02-15 NOTE — Progress Notes (Signed)
PROGRESS NOTE    Peter Williams  [id number] DOB: March 04, 1955 DOA: 02/12/2022 PCP: Sharilyn Sites, MD    Brief Narrative:  Peter Williams is a 67 y.o. male with medical history significant for GERD, who presented to [facility] ED due to exertional chest pain.  He describes his chest pain as heavy pressure like on the central part of his chest, starting from his left arm and radiating to the central part of his chest.  Associated with shortness of breath.  Worse with ambulation and improved at rest.  No prior history of heart disease.  No similar prior symptoms.  Endorses a family history of heart disease in his mother.  No use of tobacco alcohol or illicit drugs.  He took a baby aspirin around 4 PM and again around 9 PM.  His wife drove him to the hospital for further evaluation.    Assessment and Plan: NSTEMI Presented with chest pain, pressure-like, starting from his left arm and radiating to the central part of his chest.  Associated with dyspnea.  Worse with ambulation and improved at rest. -plan for [facility] 2/12 -heparin gtt.   GERD Resume home PPI  Obesity Estimated body mass index is 28.89 kg/m as calculated from the following:   Height as of this encounter: 6' (1.829 m).   Weight as of this encounter: 96.6 kg.    QTc prolongation Twelve-lead EKG QTc 495 Avoid QTc prolonging agents Optimize magnesium and potassium levels  DVT prophylaxis: heparin gtt    Code Status: Full Code   Disposition Plan:  Level of care: Progressive Status is: Inpatient Remains inpatient appropriate because: needs     Consultants:  cards   Subjective: No chest pain  Objective: Vitals:   02/14/22 1643 02/14/22 2004 02/15/22 0342 02/15/22 0832  BP: 118/64 127/71 123/63 112/63  Pulse: 72 76 78 66  Resp: 16 17 18 18  $ Temp: (!) 97.5 F (36.4 C) 97.6 F (36.4 C) 97.6 F (36.4 C) 97.9 F (36.6 C)  TempSrc: Oral Oral Oral Oral  SpO2: 96% 97% 96%   Weight:      Height:    6' (1.829 m)     Intake/Output Summary (Last 24 hours) at 02/15/2022 1225 Last data filed at 02/15/2022 0840 Gross per 24 hour  Intake 667.56 ml  Output --  Net 667.56 ml   Filed Weights   02/12/22 2056  Weight: 96.6 kg    Examination:  General: Appearance:     Overweight male in no acute distress     Lungs:      respirations unlabored  Heart:    Normal heart rate.   MS:   All extremities are intact.   Neurologic:   Awake, alert       Data Reviewed: I have personally reviewed following labs and imaging studies  CBC: Recent Labs  Lab 02/12/22 2040 02/14/22 0216 02/15/22 0234  WBC 7.9 9.6 8.5  HGB 13.5 12.9* 12.7*  HCT 42.2 37.7* 37.2*  MCV 89.0 85.5 85.7  PLT 285 266 [id number]   Basic Metabolic Panel: Recent Labs  Lab 02/12/22 2040 02/14/22 0807 02/15/22 0234  NA 141 135 138  K 3.8 4.2 3.9  CL 108 105 105  CO2 23 22 23  $ GLUCOSE 140* 115* 109*  BUN 18 17 19  $ CREATININE 1.21 1.02 1.10  CALCIUM 8.9 9.1 8.9   GFR: Estimated Creatinine Clearance: 79.6 mL/min (by C-G formula based on SCr of 1.1 mg/dL). Liver Function Tests: No results for input(s): "  AST", "ALT", "ALKPHOS", "BILITOT", "PROT", "ALBUMIN" in the last 168 hours. No results for input(s): "LIPASE", "AMYLASE" in the last 168 hours. No results for input(s): "AMMONIA" in the last 168 hours. Coagulation Profile: Recent Labs  Lab 02/12/22 2240  INR 1.0   Cardiac Enzymes: No results for input(s): "CKTOTAL", "CKMB", "CKMBINDEX", "TROPONINI" in the last 168 hours. BNP (last 3 results) No results for input(s): "PROBNP" in the last 8760 hours. HbA1C: Recent Labs    02/13/22 0452  HGBA1C 5.9*   CBG: Recent Labs  Lab 02/13/22 0208 02/13/22 0822 02/13/22 1226  GLUCAP 124* 96 158*   Lipid Profile: Recent Labs    02/13/22 0452  CHOL 187  HDL 53  LDLCALC 116*  TRIG 92  CHOLHDL 3.5   Thyroid Function Tests: No results for input(s): "TSH", "T4TOTAL", "FREET4", "T3FREE", "THYROIDAB" in the last 72  hours. Anemia Panel: No results for input(s): "VITAMINB12", "FOLATE", "FERRITIN", "TIBC", "IRON", "RETICCTPCT" in the last 72 hours. Sepsis Labs: No results for input(s): "PROCALCITON", "LATICACIDVEN" in the last 168 hours.  No results found for this or any previous visit (from the past 240 hour(s)).       Radiology Studies: ECHOCARDIOGRAM COMPLETE  Result Date: 02/13/2022    ECHOCARDIOGRAM REPORT   Patient Name:   Peter Williams Date of Exam: 02/13/2022 Medical Rec #:  [id number]      Height:       70.0 in Accession #:    [id number]     Weight:       213.0 lb Date of Birth:  1955-03-17      BSA:          2.144 m Patient Age:    31 years       BP:           122/65 mmHg Patient Gender: M              HR:           72 bpm. Exam Location:  Inpatient Procedure: 2D Echo, Cardiac Doppler, Color Doppler and Intracardiac            Opacification Agent Indications:    NSTEMI I21.4  History:        Patient has prior history of Echocardiogram examinations.                 Previous Myocardial Infarction; Risk Factors:Hypertension and                 Former Smoker.  Sonographer:    Wilkie Aye RVT RCS Referring Phys: [id number] [place]  Sonographer Comments: Technically challenging study due to limited acoustic windows and suboptimal parasternal window. IMPRESSIONS  1. Left ventricular ejection fraction, by estimation, is 60 to 65%. The left ventricle has normal function. The left ventricle has no regional wall motion abnormalities. There is mild left ventricular hypertrophy. Left ventricular diastolic parameters were normal.  2. Right ventricular systolic function is normal. The right ventricular size is normal. Tricuspid regurgitation signal is inadequate for assessing PA pressure.  3. The mitral valve is normal in structure. No evidence of mitral valve regurgitation. No evidence of mitral stenosis.  4. The aortic valve was not well visualized. Aortic valve regurgitation is not visualized. No aortic stenosis is  present.  5. The inferior vena cava is normal in size with greater than 50% respiratory variability, suggesting right atrial pressure of 3 mmHg. Comparison(s): No prior Echocardiogram. FINDINGS  Left Ventricle: Left ventricular ejection fraction, by estimation,  is 60 to 65%. The left ventricle has normal function. The left ventricle has no regional wall motion abnormalities. Definity contrast agent was given IV to delineate the left ventricular  endocardial borders. The left ventricular internal cavity size was normal in size. There is mild left ventricular hypertrophy. Left ventricular diastolic parameters were normal. Right Ventricle: The right ventricular size is normal. No increase in right ventricular wall thickness. Right ventricular systolic function is normal. Tricuspid regurgitation signal is inadequate for assessing PA pressure. Left Atrium: Left atrial size was normal in size. Right Atrium: Right atrial size was normal in size. Pericardium: There is no evidence of pericardial effusion. Mitral Valve: The mitral valve is normal in structure. No evidence of mitral valve regurgitation. No evidence of mitral valve stenosis. Tricuspid Valve: The tricuspid valve is not well visualized. Tricuspid valve regurgitation is not demonstrated. No evidence of tricuspid stenosis. Aortic Valve: The aortic valve was not well visualized. Aortic valve regurgitation is not visualized. No aortic stenosis is present. Aortic valve mean gradient measures 2.0 mmHg. Aortic valve peak gradient measures 5.2 mmHg. Aortic valve area, by VTI measures 2.07 cm. Pulmonic Valve: The pulmonic valve was not well visualized. Pulmonic valve regurgitation is not visualized. No evidence of pulmonic stenosis. Aorta: The aortic root and ascending aorta are structurally normal, with no evidence of dilitation. Venous: The inferior vena cava is normal in size with greater than 50% respiratory variability, suggesting right atrial pressure of 3 mmHg.  IAS/Shunts: No atrial level shunt detected by color flow Doppler.  LEFT VENTRICLE PLAX 2D LVIDd:         4.20 cm   Diastology LVIDs:         2.80 cm   LV e' medial:    7.22 cm/s LV PW:         1.20 cm   LV E/e' medial:  10.4 LV IVS:        1.30 cm   LV e' lateral:   8.51 cm/s LVOT diam:     2.10 cm   LV E/e' lateral: 8.8 LV SV:         44 LV SV Index:   21 LVOT Area:     3.46 cm  RIGHT VENTRICLE             IVC RV Basal diam:  3.20 cm     IVC diam: 1.90 cm RV S prime:     14.50 cm/s TAPSE (M-mode): 2.7 cm LEFT ATRIUM             Index        RIGHT ATRIUM           Index LA diam:        4.00 cm 1.87 cm/m   RA Area:     11.20 cm LA Vol (A2C):   46.3 ml 21.60 ml/m  RA Volume:   22.30 ml  10.40 ml/m LA Vol (A4C):   35.8 ml 16.70 ml/m LA Biplane Vol: 42.2 ml 19.68 ml/m  AORTIC VALVE                    PULMONIC VALVE AV Area (Vmax):    1.52 cm     PV Vmax:       0.88 m/s AV Area (Vmean):   1.70 cm     PV Peak grad:  3.1 mmHg AV Area (VTI):     2.07 cm AV Vmax:  114.00 cm/s AV Vmean:          71.400 cm/s AV VTI:            0.213 m AV Peak Grad:      5.2 mmHg AV Mean Grad:      2.0 mmHg LVOT Vmax:         49.90 cm/s LVOT Vmean:        35.000 cm/s LVOT VTI:          0.127 m LVOT/AV VTI ratio: 0.60  AORTA Ao Root diam: 2.90 cm Ao Asc diam:  3.10 cm Ao Arch diam: 2.8 cm MITRAL VALVE MV Area (PHT): 4.26 cm    SHUNTS MV Decel Time: 178 msec    Systemic VTI:  0.13 m MV E velocity: 75.00 cm/s  Systemic Diam: 2.10 cm MV A velocity: 59.50 cm/s MV E/A ratio:  1.26 Vishnu Priya Mallipeddi Electronically signed by Lorelee Cover Mallipeddi Signature Date/Time: 02/13/2022/6:57:55 PM    Final         Scheduled Meds:  [START ON 02/16/2022] aspirin EC  81 mg Oral Daily   carvedilol  6.25 mg Oral BID WC   pantoprazole  40 mg Oral Daily   rosuvastatin  20 mg Oral Daily   sodium chloride flush  3 mL Intravenous Q12H   Continuous Infusions:  sodium chloride     sodium chloride 1 mL/kg/hr (02/15/22 0600)    heparin Stopped (02/15/22 1200)     LOS: 3 days    Time spent: 45 minutes spent on chart review, discussion with nursing staff, consultants, updating family and interview/physical exam; more than 50% of that time was spent in counseling and/or coordination of care.    Geradine Girt, DO [facility] Available via Epic secure chat 7am-7pm After these hours, please refer to coverage provider listed on [email or web address] 02/15/2022, 12:25 PM

## 2022-02-15 NOTE — Progress Notes (Signed)
ANTICOAGULATION CONSULT NOTE   Pharmacy Consult for heparin Indication: chest pain/ACS  No Known Allergies  Patient Measurements: Weight: 96.6 kg (213 lb) Heparin Dosing Weight: 96.6kg  Vital Signs: Temp: 97.6 F (36.4 C) (02/12 0342) Temp Source: Oral (02/12 0342) BP: 123/63 (02/12 0342) Pulse Rate: 78 (02/12 0342)  Labs: Recent Labs    02/12/22 2040 02/12/22 2240 02/13/22 0452 02/13/22 2140 02/14/22 0216 02/14/22 0807 02/14/22 1102 02/15/22 0234  HGB 13.5  --   --   --  12.9*  --   --  12.7*  HCT 42.2  --   --   --  37.7*  --   --  37.2*  PLT 285  --   --   --  266  --   --  255  APTT  --  24  --   --   --   --   --   --   LABPROT  --  12.7  --   --   --   --   --   --   INR  --  1.0  --   --   --   --   --   --   HEPARINUNFRC  --   --    < > 0.46 0.52  --   --  0.50  CREATININE 1.21  --   --   --   --  1.02  --  1.10  TROPONINIHS 1,061*  --   --   --   --  1,818* 1,527*  --    < > = values in this interval not displayed.     CrCl cannot be calculated (Unknown ideal weight.).   Medical History: Past Medical History:  Diagnosis Date   Bruit of right carotid artery    patient unaware   DDD (degenerative disc disease), lumbosacral    Gastritis    GERD (gastroesophageal reflux disease)    Peptic ulcer disease      Assessment: 67 yo male with intermittent chest pain that started 2/9, feels like pressure, radiates into the L arm.  Pharmacy consulted to dose heparin drip for NSTEMI.  No prior AC noted  Heparin level came back therapeutic at 0.5, on 1550 units/hr. No s/sx of bleeding or infusion issues.   Goal of Therapy:  Heparin level 0.3-0.7 units/ml Monitor platelets by anticoagulation protocol: Yes   Plan:  Continue heparin at 1550 units/hr Check daily heparin level/CBC Monitor for s/sx of bleeding  Antonietta Jewel, PharmD, [place] Pharmacist  Phone: [phone] 02/15/2022 7:28 AM  Please check AMION for all [place] phone numbers After  10:00 PM, call [place] [phone]

## 2022-02-15 NOTE — Discharge Instructions (Signed)
Information about your medication: Brilinta (anti-platelet agent)  Generic Name (Brand): ticagrelor (Brilinta), twice daily medication  PURPOSE: You are taking this medication along with aspirin to lower your chance of having a heart attack, stroke, or blood clots in your heart stent. These can be fatal. Brilinta and aspirin help prevent platelets from sticking together and forming a clot that can block an artery or your stent.   Common SIDE EFFECTS you may experience include: bruising or bleeding more easily, shortness of breath  Do not stop taking BRILINTA without talking to the doctor who prescribes it for you. People who are treated with a stent and stop taking Brilinta too soon, have a higher risk of getting a blood clot in the stent, having a heart attack, or dying. If you stop Brilinta because of bleeding, or for other reasons, your risk of a heart attack or stroke may increase.   Avoid taking NSAID agents or anti-inflammatory medications such as ibuprofen, naproxen given increased bleed risk with plavix - can use acetaminophen (Tylenol) if needed for pain.  Tell all of your doctors and dentists that you are taking Brilinta. They should talk to the doctor who prescribed Brilinta for you before you have any surgery or invasive procedure.   Contact your health care provider if you experience: severe or uncontrollable bleeding, pink/red/brown urine, vomiting blood or vomit that looks like "coffee grounds", red or black stools (looks like tar), coughing up blood or blood clots ----------------------------------------------------------------------------------------------------------------------

## 2022-02-15 NOTE — TOC Benefit Eligibility Note (Signed)
Patient Teacher, English as a foreign language completed.    The patient is currently admitted and upon discharge could be taking Brilinta 90 mg.  The current 30 day co-pay is $47.00.   The patient is insured through [employer], [place] Patient Advocate Specialist [place] Patient Advocate Team Direct Number: [phone]  Fax: [phone]

## 2022-02-15 NOTE — Progress Notes (Addendum)
Rounding Note    Patient Name: Peter Williams Date of Encounter: 02/15/2022  [place] Cardiologist: None   Subjective   No chest pain overnight. Planned for cardiac cath today  Inpatient Medications    Scheduled Meds:  carvedilol  6.25 mg Oral BID WC   pantoprazole  40 mg Oral Daily   rosuvastatin  20 mg Oral Daily   sodium chloride flush  3 mL Intravenous Q12H   Continuous Infusions:  sodium chloride     sodium chloride 1 mL/kg/hr (02/15/22 0600)   heparin 1,550 Units/hr (02/15/22 [id number])   PRN Meds: sodium chloride, acetaminophen, HYDROmorphone (DILAUDID) injection, melatonin, nitroGLYCERIN, polyethylene glycol, prochlorperazine, sodium chloride flush   Vital Signs    Vitals:   02/14/22 1256 02/14/22 1643 02/14/22 2004 02/15/22 0342  BP: 127/66 118/64 127/71 123/63  Pulse: 73 72 76 78  Resp: 18 16 17 18  $ Temp: 97.8 F (36.6 C) (!) 97.5 F (36.4 C) 97.6 F (36.4 C) 97.6 F (36.4 C)  TempSrc: Oral Oral Oral Oral  SpO2: 97% 96% 97% 96%  Weight:       No intake or output data in the 24 hours ending 02/15/22 0811    02/12/2022    8:56 PM 06/29/2021    1:14 PM 01/15/2019    2:51 PM  Last 3 Weights  Weight (lbs) 213 lb 209 lb 207 lb 9.6 oz  Weight (kg) 96.616 kg 94.802 kg 94.167 kg      Telemetry    Sinus Rhythm - Personally Reviewed  ECG    No new tracing this morning  Physical Exam   GEN: No acute distress.   Neck: No JVD Cardiac: RRR, no murmurs, rubs, or gallops.  Respiratory: Clear to auscultation bilaterally. GI: Soft, nontender, non-distended  MS: No edema; No deformity. Neuro:  Nonfocal  Psych: Normal affect   Labs    High Sensitivity Troponin:   Recent Labs  Lab 02/12/22 2040 02/14/22 0807 02/14/22 1102  TROPONINIHS 1,061* 1,818* 1,527*     Chemistry Recent Labs  Lab 02/12/22 2040 02/14/22 0807 02/15/22 0234  NA 141 135 138  K 3.8 4.2 3.9  CL 108 105 105  CO2 23 22 23  $ GLUCOSE 140* 115* 109*  BUN 18 17 19   $ CREATININE 1.21 1.02 1.10  CALCIUM 8.9 9.1 8.9  GFRNONAA >60 >60 >60  ANIONGAP 10 8 10    $ Lipids  Recent Labs  Lab 02/13/22 0452  CHOL 187  TRIG 92  HDL 53  LDLCALC 116*  CHOLHDL 3.5    Hematology Recent Labs  Lab 02/12/22 2040 02/14/22 0216 02/15/22 0234  WBC 7.9 9.6 8.5  RBC 4.74 4.41 4.34  HGB 13.5 12.9* 12.7*  HCT 42.2 37.7* 37.2*  MCV 89.0 85.5 85.7  MCH 28.5 29.3 29.3  MCHC 32.0 34.2 34.1  RDW 13.6 13.8 13.7  PLT 285 266 255   Thyroid No results for input(s): "TSH", "FREET4" in the last 168 hours.  BNPNo results for input(s): "BNP", "PROBNP" in the last 168 hours.  DDimer No results for input(s): "DDIMER" in the last 168 hours.   Radiology    ECHOCARDIOGRAM COMPLETE  Result Date: 02/13/2022    ECHOCARDIOGRAM REPORT   Patient Name:   BRADIN HARDIN Date of Exam: 02/13/2022 Medical Rec #:  [id number]      Height:       70.0 in Accession #:    [id number]     Weight:  213.0 lb Date of Birth:  15-Jul-1955      BSA:          2.144 m Patient Age:    24 years       BP:           122/65 mmHg Patient Gender: M              HR:           72 bpm. Exam Location:  Inpatient Procedure: 2D Echo, Cardiac Doppler, Color Doppler and Intracardiac            Opacification Agent Indications:    NSTEMI I21.4  History:        Patient has prior history of Echocardiogram examinations.                 Previous Myocardial Infarction; Risk Factors:Hypertension and                 Former Smoker.  Sonographer:    Wilkie Aye RVT RCS Referring Phys: [id number] Dunn Loring  Sonographer Comments: Technically challenging study due to limited acoustic windows and suboptimal parasternal window. IMPRESSIONS  1. Left ventricular ejection fraction, by estimation, is 60 to 65%. The left ventricle has normal function. The left ventricle has no regional wall motion abnormalities. There is mild left ventricular hypertrophy. Left ventricular diastolic parameters were normal.  2. Right ventricular systolic  function is normal. The right ventricular size is normal. Tricuspid regurgitation signal is inadequate for assessing PA pressure.  3. The mitral valve is normal in structure. No evidence of mitral valve regurgitation. No evidence of mitral stenosis.  4. The aortic valve was not well visualized. Aortic valve regurgitation is not visualized. No aortic stenosis is present.  5. The inferior vena cava is normal in size with greater than 50% respiratory variability, suggesting right atrial pressure of 3 mmHg. Comparison(s): No prior Echocardiogram. FINDINGS  Left Ventricle: Left ventricular ejection fraction, by estimation, is 60 to 65%. The left ventricle has normal function. The left ventricle has no regional wall motion abnormalities. Definity contrast agent was given IV to delineate the left ventricular  endocardial borders. The left ventricular internal cavity size was normal in size. There is mild left ventricular hypertrophy. Left ventricular diastolic parameters were normal. Right Ventricle: The right ventricular size is normal. No increase in right ventricular wall thickness. Right ventricular systolic function is normal. Tricuspid regurgitation signal is inadequate for assessing PA pressure. Left Atrium: Left atrial size was normal in size. Right Atrium: Right atrial size was normal in size. Pericardium: There is no evidence of pericardial effusion. Mitral Valve: The mitral valve is normal in structure. No evidence of mitral valve regurgitation. No evidence of mitral valve stenosis. Tricuspid Valve: The tricuspid valve is not well visualized. Tricuspid valve regurgitation is not demonstrated. No evidence of tricuspid stenosis. Aortic Valve: The aortic valve was not well visualized. Aortic valve regurgitation is not visualized. No aortic stenosis is present. Aortic valve mean gradient measures 2.0 mmHg. Aortic valve peak gradient measures 5.2 mmHg. Aortic valve area, by VTI measures 2.07 cm. Pulmonic Valve: The  pulmonic valve was not well visualized. Pulmonic valve regurgitation is not visualized. No evidence of pulmonic stenosis. Aorta: The aortic root and ascending aorta are structurally normal, with no evidence of dilitation. Venous: The inferior vena cava is normal in size with greater than 50% respiratory variability, suggesting right atrial pressure of 3 mmHg. IAS/Shunts: No atrial level shunt detected by color flow Doppler.  LEFT VENTRICLE PLAX 2D LVIDd:         4.20 cm   Diastology LVIDs:         2.80 cm   LV e' medial:    7.22 cm/s LV PW:         1.20 cm   LV E/e' medial:  10.4 LV IVS:        1.30 cm   LV e' lateral:   8.51 cm/s LVOT diam:     2.10 cm   LV E/e' lateral: 8.8 LV SV:         44 LV SV Index:   21 LVOT Area:     3.46 cm  RIGHT VENTRICLE             IVC RV Basal diam:  3.20 cm     IVC diam: 1.90 cm RV S prime:     14.50 cm/s TAPSE (M-mode): 2.7 cm LEFT ATRIUM             Index        RIGHT ATRIUM           Index LA diam:        4.00 cm 1.87 cm/m   RA Area:     11.20 cm LA Vol (A2C):   46.3 ml 21.60 ml/m  RA Volume:   22.30 ml  10.40 ml/m LA Vol (A4C):   35.8 ml 16.70 ml/m LA Biplane Vol: 42.2 ml 19.68 ml/m  AORTIC VALVE                    PULMONIC VALVE AV Area (Vmax):    1.52 cm     PV Vmax:       0.88 m/s AV Area (Vmean):   1.70 cm     PV Peak grad:  3.1 mmHg AV Area (VTI):     2.07 cm AV Vmax:           114.00 cm/s AV Vmean:          71.400 cm/s AV VTI:            0.213 m AV Peak Grad:      5.2 mmHg AV Mean Grad:      2.0 mmHg LVOT Vmax:         49.90 cm/s LVOT Vmean:        35.000 cm/s LVOT VTI:          0.127 m LVOT/AV VTI ratio: 0.60  AORTA Ao Root diam: 2.90 cm Ao Asc diam:  3.10 cm Ao Arch diam: 2.8 cm MITRAL VALVE MV Area (PHT): 4.26 cm    SHUNTS MV Decel Time: 178 msec    Systemic VTI:  0.13 m MV E velocity: 75.00 cm/s  Systemic Diam: 2.10 cm MV A velocity: 59.50 cm/s MV E/A ratio:  1.26 Vishnu Priya Mallipeddi Electronically signed by Lorelee Cover Mallipeddi Signature Date/Time:  02/13/2022/6:57:55 PM    Final     Cardiac Studies   Echo: 02/13/2022  IMPRESSIONS     1. Left ventricular ejection fraction, by estimation, is 60 to 65%. The  left ventricle has normal function. The left ventricle has no regional  wall motion abnormalities. There is mild left ventricular hypertrophy.  Left ventricular diastolic parameters  were normal.   2. Right ventricular systolic function is normal. The right ventricular  size is normal. Tricuspid regurgitation signal is inadequate for assessing  PA pressure.   3. The mitral valve is normal in  structure. No evidence of mitral valve  regurgitation. No evidence of mitral stenosis.   4. The aortic valve was not well visualized. Aortic valve regurgitation  is not visualized. No aortic stenosis is present.   5. The inferior vena cava is normal in size with greater than 50%  respiratory variability, suggesting right atrial pressure of 3 mmHg.   Comparison(s): No prior Echocardiogram.   FINDINGS   Left Ventricle: Left ventricular ejection fraction, by estimation, is 60  to 65%. The left ventricle has normal function. The left ventricle has no  regional wall motion abnormalities. Definity contrast agent was given IV  to delineate the left ventricular   endocardial borders. The left ventricular internal cavity size was normal  in size. There is mild left ventricular hypertrophy. Left ventricular  diastolic parameters were normal.   Right Ventricle: The right ventricular size is normal. No increase in  right ventricular wall thickness. Right ventricular systolic function is  normal. Tricuspid regurgitation signal is inadequate for assessing PA  pressure.   Left Atrium: Left atrial size was normal in size.   Right Atrium: Right atrial size was normal in size.   Pericardium: There is no evidence of pericardial effusion.   Mitral Valve: The mitral valve is normal in structure. No evidence of  mitral valve regurgitation. No  evidence of mitral valve stenosis.   Tricuspid Valve: The tricuspid valve is not well visualized. Tricuspid  valve regurgitation is not demonstrated. No evidence of tricuspid  stenosis.   Aortic Valve: The aortic valve was not well visualized. Aortic valve  regurgitation is not visualized. No aortic stenosis is present. Aortic  valve mean gradient measures 2.0 mmHg. Aortic valve peak gradient measures  5.2 mmHg. Aortic valve area, by VTI  measures 2.07 cm.   Pulmonic Valve: The pulmonic valve was not well visualized. Pulmonic valve  regurgitation is not visualized. No evidence of pulmonic stenosis.   Aorta: The aortic root and ascending aorta are structurally normal, with  no evidence of dilitation.   Venous: The inferior vena cava is normal in size with greater than 50%  respiratory variability, suggesting right atrial pressure of 3 mmHg.   IAS/Shunts: No atrial level shunt detected by color flow Doppler.    Patient Profile     67 y.o. male with PMH of GERD who presented with chest pain and found to have NSTEMI  Assessment & Plan    NSTEMI -- High-sensitivity troponin peaked at 1818.  Plan for cardiac catheterization today. Echo showed LVEF of 60 to 65%, no regional wall motion abnormality, mild LVH, normal RV size and function, no evidence of valvular disease. No chest pain overnight. -- Continue IV heparin, aspirin, Coreg 6.25 mg twice daily, statin  Hyperlipidemia -- LDL 116, HDL 53. Check LP(a) -- Continue Crestor 20 mg daily, increased from 55m daily on admission  Hypertension -- Well-controlled continue Coreg 6.25 mg twice daily  GERD -- PPI  For questions or updates, please contact CLinwoodPlease consult [email or web address] for contact info under        Signed, LReino Bellis NP  02/15/2022, 8:11 AM    Patient seen, examined. Available data reviewed. Agree with findings, assessment, and plan as outlined by LReino Bellis NP.  The patient is  independently interviewed and examined.  He is alert, oriented, no distress.  HEENT is normal, JVP is normal, carotid upstrokes normal without bruits, lungs are clear bilaterally, heart is regular rate rhythm no murmur gallop, abdomen soft nontender, extremities have  no edema, right radial pulses 2+.  Notes reviewed, imaging data and labs reviewed.  The patient has had no recurrent chest discomfort over the last 24 hours.  He has had a non-STEMI and cardiac catheterization is clearly indicated. I have reviewed the risks, indications, and alternatives to cardiac catheterization, possible angioplasty, and stenting with the patient. Risks include but are not limited to bleeding, infection, vascular injury, stroke, myocardial infection, arrhythmia, kidney injury, radiation-related injury in the case of prolonged fluoroscopy use, emergency cardiac surgery, and death. The patient understands the risks of serious complication is 1-2 in [id number] with diagnostic cardiac cath and 1-2% or less with angioplasty/stenting.   Further plans pending cardiac cath results.  He remains on aspirin, rosuvastatin, carvedilol, and IV heparin.  Sherren Mocha, M.D. 02/15/2022 10:24 AM

## 2022-02-16 ENCOUNTER — Other Ambulatory Visit (HOSPITAL_COMMUNITY): Payer: Self-pay

## 2022-02-16 ENCOUNTER — Encounter (HOSPITAL_COMMUNITY): Payer: Self-pay | Admitting: Interventional Cardiology

## 2022-02-16 DIAGNOSIS — I214 Non-ST elevation (NSTEMI) myocardial infarction: Secondary | ICD-10-CM | POA: Diagnosis not present

## 2022-02-16 LAB — BASIC METABOLIC PANEL
Anion gap: 8 (ref 5–15)
BUN: 17 mg/dL (ref 8–23)
CO2: 19 mmol/L — ABNORMAL LOW (ref 22–32)
Calcium: 8.9 mg/dL (ref 8.9–10.3)
Chloride: 110 mmol/L (ref 98–111)
Creatinine, Ser: 1.11 mg/dL (ref 0.61–1.24)
GFR, Estimated: >60 mL/min (ref >60)
Glucose, Bld: 114 mg/dL — ABNORMAL HIGH (ref 70–99)
Potassium: 4 mmol/L (ref 3.5–5.1)
Sodium: 137 mmol/L (ref 135–145)

## 2022-02-16 LAB — CBC
HCT: 38 % — ABNORMAL LOW (ref 39.0–52.0)
Hemoglobin: 13.1 g/dL (ref 13.0–17.0)
MCH: 29.8 pg (ref 26.0–34.0)
MCHC: 34.5 g/dL (ref 30.0–36.0)
MCV: 86.6 fL (ref 80.0–100.0)
Platelets: 258 10*3/uL (ref 150–400)
RBC: 4.39 MIL/uL (ref 4.22–5.81)
RDW: 13.7 % (ref 11.5–15.5)
WBC: 9 10*3/uL (ref 4.0–10.5)
nRBC: 0 % (ref 0.0–0.2)

## 2022-02-16 LAB — LIPOPROTEIN A (LPA): Lipoprotein (a): 8.9 nmol/L (ref <75.0)

## 2022-02-16 MED ORDER — NITROGLYCERIN 0.4 MG SL SUBL
0.4000 mg | SUBLINGUAL_TABLET | SUBLINGUAL | 1 refills | Status: DC | PRN
  Filled 2022-02-16: qty 25, 7d supply, fill #0

## 2022-02-16 MED ORDER — TICAGRELOR 90 MG PO TABS
90.0000 mg | ORAL_TABLET | Freq: Two times a day (BID) | ORAL | 1 refills | Status: DC
  Filled 2022-02-16 – 2022-03-13 (×2): qty 60, 30d supply, fill #0

## 2022-02-16 MED ORDER — ASPIRIN 81 MG PO TBEC
81.0000 mg | DELAYED_RELEASE_TABLET | Freq: Every day | ORAL | 12 refills | Status: DC
  Filled 2022-02-16 – 2022-03-13 (×2): qty 30, 30d supply, fill #0

## 2022-02-16 MED ORDER — CARVEDILOL 6.25 MG PO TABS
6.2500 mg | ORAL_TABLET | Freq: Two times a day (BID) | ORAL | 1 refills | Status: DC
  Filled 2022-02-16 – 2022-03-13 (×2): qty 60, 30d supply, fill #0

## 2022-02-16 MED ORDER — ROSUVASTATIN CALCIUM 20 MG PO TABS
20.0000 mg | ORAL_TABLET | Freq: Every day | ORAL | 0 refills | Status: DC
  Filled 2022-02-16: qty 30, 30d supply, fill #0

## 2022-02-16 NOTE — Progress Notes (Signed)
CARDIAC REHAB PHASE I    Pt oob ambulating in room with no CP or SOB. Reports ambulating last night and this morning with no difficulties. Post MI/ stent education including site care, restrictions, risk factors, MI booklet, exercise guidelines, heart healthy diet,antiplatelet therapy importance and CRP2 reviewed.  All question and concerns addressed. Will refer to [facility] for CRP2 per pt request. Plan for home today.   [id number] Vanessa Barbara, RN BSN 02/16/2022 9:58 AM

## 2022-02-16 NOTE — Progress Notes (Signed)
Explained discharge instructions to patient. Reviewed follow up appointment and next medication administration times. Also reviewed education. Patient verbalized having an understanding for instructions given. All belongings are in the patient's possession to include TOC meds. IV and telemetry were removed. CCMD was notified. No other needs verbalized. Will transport downstairs for discharge when ready.

## 2022-02-16 NOTE — Care Management Important Message (Signed)
Important Message  Patient Details  Name: Peter Williams MRN: [id number] Date of Birth: [birth year]   Medicare Important Message Given:  Yes     Shelda Altes 02/16/2022, 11:21 AM

## 2022-02-16 NOTE — Progress Notes (Addendum)
Rounding Note    Patient Name: Peter Williams Date of Encounter: 02/16/2022  [place] Cardiologist: None   Subjective   Feeling well this morning. No chest pain. Eager to go home.   Inpatient Medications    Scheduled Meds:  aspirin EC  81 mg Oral Daily   carvedilol  6.25 mg Oral BID WC   pantoprazole  40 mg Oral Daily   rosuvastatin  20 mg Oral Daily   sodium chloride flush  3 mL Intravenous Q12H   sodium chloride flush  3 mL Intravenous Q12H   ticagrelor  90 mg Oral BID   Continuous Infusions:  sodium chloride     PRN Meds: sodium chloride, acetaminophen, HYDROmorphone (DILAUDID) injection, melatonin, nitroGLYCERIN, ondansetron (ZOFRAN) IV, polyethylene glycol, prochlorperazine, sodium chloride flush   Vital Signs    Vitals:   02/15/22 1642 02/15/22 2042 02/15/22 2234 02/16/22 0556  BP: (!) 130/54 124/62 113/66 120/64  Pulse: 79 77  69  Resp: 16 16  17  $ Temp: (!) 97.1 F (36.2 C) 97.6 F (36.4 C)  97.8 F (36.6 C)  TempSrc: Oral Oral  Oral  SpO2: 98% 98% 96% 97%  Weight:      Height:        Intake/Output Summary (Last 24 hours) at 02/16/2022 0655 Last data filed at 02/15/2022 0840 Gross per 24 hour  Intake 667.56 ml  Output --  Net 667.56 ml      02/12/2022    8:56 PM 06/29/2021    1:14 PM 01/15/2019    2:51 PM  Last 3 Weights  Weight (lbs) 213 lb 209 lb 207 lb 9.6 oz  Weight (kg) 96.616 kg 94.802 kg 94.167 kg      Telemetry    Sinus Rhythm - Personally Reviewed  ECG    Sinus Rhythm 73 bpm - Personally Reviewed  Physical Exam   GEN: No acute distress.   Neck: No JVD Cardiac: RRR, no murmurs, rubs, or gallops.  Respiratory: Clear to auscultation bilaterally. GI: Soft, nontender, non-distended  MS: No edema; No deformity. Right radial cath site stable Neuro:  Nonfocal  Psych: Normal affect   Labs    High Sensitivity Troponin:   Recent Labs  Lab 02/12/22 2040 02/14/22 0807 02/14/22 1102  TROPONINIHS 1,061* 1,818*  1,527*     Chemistry Recent Labs  Lab 02/14/22 0807 02/15/22 0234 02/16/22 0205  NA 135 138 137  K 4.2 3.9 4.0  CL 105 105 110  CO2 22 23 19*  GLUCOSE 115* 109* 114*  BUN 17 19 17  $ CREATININE 1.02 1.10 1.11  CALCIUM 9.1 8.9 8.9  GFRNONAA >60 >60 >60  ANIONGAP 8 10 8    $ Lipids  Recent Labs  Lab 02/13/22 0452  CHOL 187  TRIG 92  HDL 53  LDLCALC 116*  CHOLHDL 3.5    Hematology Recent Labs  Lab 02/14/22 0216 02/15/22 0234 02/16/22 0205  WBC 9.6 8.5 9.0  RBC 4.41 4.34 4.39  HGB 12.9* 12.7* 13.1  HCT 37.7* 37.2* 38.0*  MCV 85.5 85.7 86.6  MCH 29.3 29.3 29.8  MCHC 34.2 34.1 34.5  RDW 13.8 13.7 13.7  PLT 266 255 258   Thyroid No results for input(s): "TSH", "FREET4" in the last 168 hours.  BNPNo results for input(s): "BNP", "PROBNP" in the last 168 hours.  DDimer No results for input(s): "DDIMER" in the last 168 hours.   Radiology    CARDIAC CATHETERIZATION  Result Date: 02/15/2022   Ost LAD to  Prox LAD lesion is 25% stenosed.   Mid Cx lesion is 25% stenosed.   1st Mrg-1 lesion is 90% stenosed.  A drug-eluting stent was successfully placed using a SYNERGY XD 2.50X20, postdilated to greater than 3 mm in diameter.  The stent started in the OM proximal to the bifurcation and extended into the lateral branch of the OM, jailing the more medial branch.   Post intervention, there is a 0% residual stenosis.   Balloon angioplasty was performed using a BALLN SAPPHIRE 2.0X12 through the stent struts to restore flow to this branch.   Post intervention, there is a 10% residual stenosis.   The left ventricular systolic function is normal.   LV end diastolic pressure is normal.   The left ventricular ejection fraction is 55-65% by visual estimate.   There is no aortic valve stenosis. Successful PCI of the bifurcation lesion in the OM1 with stent to the main branch and balloon angioplasty to the smaller more medial branch of the OM1.  Continue dual antiplatelet therapy along with  aggressive secondary prevention.  Results conveyed to the patient's wife, Butch Penny.    Cardiac Studies   Cath: 02/15/2022    Ost LAD to Prox LAD lesion is 25% stenosed.   Mid Cx lesion is 25% stenosed.   1st Mrg-1 lesion is 90% stenosed.  A drug-eluting stent was successfully placed using a SYNERGY XD 2.50X20, postdilated to greater than 3 mm in diameter.  The stent started in the OM proximal to the bifurcation and extended into the lateral branch of the OM, jailing the more medial branch.   Post intervention, there is a 0% residual stenosis.   Balloon angioplasty was performed using a BALLN SAPPHIRE 2.0X12 through the stent struts to restore flow to this branch.   Post intervention, there is a 10% residual stenosis.   The left ventricular systolic function is normal.   LV end diastolic pressure is normal.   The left ventricular ejection fraction is 55-65% by visual estimate.   There is no aortic valve stenosis.   Successful PCI of the bifurcation lesion in the OM1 with stent to the main branch and balloon angioplasty to the smaller more medial branch of the OM1.  Continue dual antiplatelet therapy along with aggressive secondary prevention.     Results conveyed to the patient's wife, Butch Penny.    Diagnostic Dominance: Co-dominant  Intervention    Patient Profile     67 y.o. male with PMH of GERD who presented with chest pain and found to have NSTEMI   Assessment & Plan    NSTEMI -- High-sensitivity troponin peaked at 1818. Underwent cardiac cath noted above with 90% 1st OM treated with PCI/DES x1, extended into the lateral branch of OM, jailing more medical branch treated with balloon angioplasty. Recommendations for DAPT with ASA/Brilinta for at least one year. Echo showed LVEF of 60 to 65%, no regional wall motion abnormality, mild LVH, normal RV size and function, no evidence of valvular disease. CR to see.  -- Continue aspirin, Brilinta, Coreg 6.25 mg twice daily, statin    Hyperlipidemia -- LDL 116, HDL 53. Check LP(a) -- Continue Crestor 20 mg daily, increased from 84m daily on admission -- need FLP/LFTs in 8 weeks    Hypertension -- Well-controlled continue Coreg 6.25 mg twice daily   GERD -- PPI  Will arrange outpatient follow up in the office.  For questions or updates, please contact [place] [email or web address] for contact info under  Signed, Reino Bellis, NP  02/16/2022, 6:55 AM    Patient seen, examined. Available data reviewed. Agree with findings, assessment, and plan as outlined by Reino Bellis, NP. On my exam: Vitals:   02/16/22 0556 02/16/22 0844  BP: 120/64 135/79  Pulse: 69   Resp: 17 17  Temp: 97.8 F (36.6 C) 98.6 F (37 C)  SpO2: 97%    Pt is alert and oriented, NAD HEENT: normal Neck: JVP - normal Lungs: CTA bilaterally CV: RRR without murmur or gallop Abd: soft, NT, Positive BS, no hepatomegaly Ext: no C/C/E, distal pulses intact and equal. Right radial cath site clear Skin: warm/dry no rash  Cath films personally reviewed. Really nice PCI result of OM bifurcation with stenting of the larger branch and side-branch PTCA of the smaller branch. Reviewed medications as outlined above. Post-PCI restrictions reviewed with the patient. Medically stable for DC today. Cards follow-up will be arranged.     Sherren Mocha, M.D. 02/16/2022 10:55 AM

## 2022-02-16 NOTE — Discharge Summary (Signed)
Physician Discharge Summary  Peter RASPA [id number] DOB: 02-Dec-1955 DOA: 02/12/2022  PCP: Sharilyn Sites, MD  Admit date: 02/12/2022 Discharge date: 02/16/2022  Admitted From: home Discharge disposition: home   Recommendations for Outpatient Follow-Up:   Cardiac rehab FLP/LFTs 8 weeks (increased statin dose)   Discharge Diagnosis:   Principal Problem:   NSTEMI (non-ST elevated myocardial infarction) [facility]) Active Problems:   Primary hypertension    Discharge Condition: Improved.  Diet recommendation: Low sodium, heart healthy  Wound care: None.  Code status: Full.   History of Present Illness:    Peter Williams is a 67 y.o. male with medical history significant for GERD, who presented to [facility] ED due to exertional chest pain.  He describes his chest pain as heavy pressure like on the central part of his chest, starting from his left arm and radiating to the central part of his chest.  Associated with shortness of breath.  Worse with ambulation and improved at rest.  No prior history of heart disease.  No similar prior symptoms.  Endorses a family history of heart disease in his mother.  No use of tobacco alcohol or illicit drugs.  He took a baby aspirin around 4 PM and again around 9 PM.  His wife drove him to the hospital for further evaluation.   In the ED, afebrile with elevated BPs.  Lab studies notable for significantly elevated first set of high-sensitivity troponin 1061.  Twelve-lead EKG notable for ST T depression involving inferior leads.  The patient was started on heparin drip in the ED.  Admitted by [facility], hospitalist service, and transferred to [facility].  Cardiology consulted and will see in consultation.   At the time of this visit, the patient had persistent left arm pain and his chest pain is recurrent with exertion.  Discussed the case with cardiology, Dr. Vickki Muff, who will see the patient once he arrives to the hospital.   Hospital Course by  Problem:   NSTEMI Presented with chest pain, pressure-like, starting from his left arm and radiating to the central part of his chest.  Associated with dyspnea.  Worse with ambulation and improved at rest. -s/p LHC with stent placement -- Continue aspirin, Brilinta, Coreg 6.25 mg twice daily, statin    GERD Resume home PPI   Overweight  Estimated body mass index is 28.89 kg/m as calculated from the following:   Height as of this encounter: 6' (1.829 m).   Weight as of this encounter: 96.6 kg.    QTc prolongation Twelve-lead EKG QTc 495 Avoid QTc prolonging agents Optimize magnesium and potassium levels   HLD -statin increased -LFTs/FLP in 8 weeks  Hypertension -- Well-controlled continue Coreg 6.25 mg twice daily  Medical Consultants:   cards    Discharge Exam:   Vitals:   02/16/22 0556 02/16/22 0844  BP: 120/64 135/79  Pulse: 69   Resp: 17 17  Temp: 97.8 F (36.6 C) 98.6 F (37 C)  SpO2: 97%    Vitals:   02/15/22 2042 02/15/22 2234 02/16/22 0556 02/16/22 0844  BP: 124/62 113/66 120/64 135/79  Pulse: 77  69   Resp: 16  17 17  $ Temp: 97.6 F (36.4 C)  97.8 F (36.6 C) 98.6 F (37 C)  TempSrc: Oral  Oral Oral  SpO2: 98% 96% 97%   Weight:      Height:        General exam: Appears calm and comfortable.    The results  of significant diagnostics from this hospitalization (including imaging, microbiology, ancillary and laboratory) are listed below for reference.     Procedures and Diagnostic Studies:   ECHOCARDIOGRAM COMPLETE  Result Date: 02/13/2022    ECHOCARDIOGRAM REPORT   Patient Name:   Peter PICKETT Date of Exam: 02/13/2022 Medical Rec #:  [id number]      Height:       70.0 in Accession #:    [id number]     Weight:       213.0 lb Date of Birth:  1955/07/26      BSA:          2.144 m Patient Age:    67 years       BP:           122/65 mmHg Patient Gender: M              HR:           72 bpm. Exam Location:  Inpatient Procedure: 2D Echo, Cardiac  Doppler, Color Doppler and Intracardiac            Opacification Agent Indications:    NSTEMI I21.4  History:        Patient has prior history of Echocardiogram examinations.                 Previous Myocardial Infarction; Risk Factors:Hypertension and                 Former Smoker.  Sonographer:    Wilkie Aye RVT RCS Referring Phys: [id number] [facility]  Sonographer Comments: Technically challenging study due to limited acoustic windows and suboptimal parasternal window. IMPRESSIONS  1. Left ventricular ejection fraction, by estimation, is 60 to 65%. The left ventricle has normal function. The left ventricle has no regional wall motion abnormalities. There is mild left ventricular hypertrophy. Left ventricular diastolic parameters were normal.  2. Right ventricular systolic function is normal. The right ventricular size is normal. Tricuspid regurgitation signal is inadequate for assessing PA pressure.  3. The mitral valve is normal in structure. No evidence of mitral valve regurgitation. No evidence of mitral stenosis.  4. The aortic valve was not well visualized. Aortic valve regurgitation is not visualized. No aortic stenosis is present.  5. The inferior vena cava is normal in size with greater than 50% respiratory variability, suggesting right atrial pressure of 3 mmHg. Comparison(s): No prior Echocardiogram. FINDINGS  Left Ventricle: Left ventricular ejection fraction, by estimation, is 60 to 65%. The left ventricle has normal function. The left ventricle has no regional wall motion abnormalities. Definity contrast agent was given IV to delineate the left ventricular  endocardial borders. The left ventricular internal cavity size was normal in size. There is mild left ventricular hypertrophy. Left ventricular diastolic parameters were normal. Right Ventricle: The right ventricular size is normal. No increase in right ventricular wall thickness. Right ventricular systolic function is normal. Tricuspid  regurgitation signal is inadequate for assessing PA pressure. Left Atrium: Left atrial size was normal in size. Right Atrium: Right atrial size was normal in size. Pericardium: There is no evidence of pericardial effusion. Mitral Valve: The mitral valve is normal in structure. No evidence of mitral valve regurgitation. No evidence of mitral valve stenosis. Tricuspid Valve: The tricuspid valve is not well visualized. Tricuspid valve regurgitation is not demonstrated. No evidence of tricuspid stenosis. Aortic Valve: The aortic valve was not well visualized. Aortic valve regurgitation is not visualized. No aortic stenosis is present. Aortic valve  mean gradient measures 2.0 mmHg. Aortic valve peak gradient measures 5.2 mmHg. Aortic valve area, by VTI measures 2.07 cm. Pulmonic Valve: The pulmonic valve was not well visualized. Pulmonic valve regurgitation is not visualized. No evidence of pulmonic stenosis. Aorta: The aortic root and ascending aorta are structurally normal, with no evidence of dilitation. Venous: The inferior vena cava is normal in size with greater than 50% respiratory variability, suggesting right atrial pressure of 3 mmHg. IAS/Shunts: No atrial level shunt detected by color flow Doppler.  LEFT VENTRICLE PLAX 2D LVIDd:         4.20 cm   Diastology LVIDs:         2.80 cm   LV e' medial:    7.22 cm/s LV PW:         1.20 cm   LV E/e' medial:  10.4 LV IVS:        1.30 cm   LV e' lateral:   8.51 cm/s LVOT diam:     2.10 cm   LV E/e' lateral: 8.8 LV SV:         44 LV SV Index:   21 LVOT Area:     3.46 cm  RIGHT VENTRICLE             IVC RV Basal diam:  3.20 cm     IVC diam: 1.90 cm RV S prime:     14.50 cm/s TAPSE (M-mode): 2.7 cm LEFT ATRIUM             Index        RIGHT ATRIUM           Index LA diam:        4.00 cm 1.87 cm/m   RA Area:     11.20 cm LA Vol (A2C):   46.3 ml 21.60 ml/m  RA Volume:   22.30 ml  10.40 ml/m LA Vol (A4C):   35.8 ml 16.70 ml/m LA Biplane Vol: 42.2 ml 19.68 ml/m  AORTIC  VALVE                    PULMONIC VALVE AV Area (Vmax):    1.52 cm     PV Vmax:       0.88 m/s AV Area (Vmean):   1.70 cm     PV Peak grad:  3.1 mmHg AV Area (VTI):     2.07 cm AV Vmax:           114.00 cm/s AV Vmean:          71.400 cm/s AV VTI:            0.213 m AV Peak Grad:      5.2 mmHg AV Mean Grad:      2.0 mmHg LVOT Vmax:         49.90 cm/s LVOT Vmean:        35.000 cm/s LVOT VTI:          0.127 m LVOT/AV VTI ratio: 0.60  AORTA Ao Root diam: 2.90 cm Ao Asc diam:  3.10 cm Ao Arch diam: 2.8 cm MITRAL VALVE MV Area (PHT): 4.26 cm    SHUNTS MV Decel Time: 178 msec    Systemic VTI:  0.13 m MV E velocity: 75.00 cm/s  Systemic Diam: 2.10 cm MV A velocity: 59.50 cm/s MV E/A ratio:  1.26 Vishnu Priya Mallipeddi Electronically signed by Lorelee Cover Mallipeddi Signature Date/Time: 02/13/2022/6:57:55 PM    Final    DG Chest  Port 1 View  Result Date: 02/12/2022 CLINICAL DATA:  Chest pain. EXAM: PORTABLE CHEST 1 VIEW COMPARISON:  Chest radiograph dated 01/02/2008. FINDINGS: No focal consolidation, pleural effusion, or pneumothorax. The cardiac silhouette is within limits. Atherosclerotic calcification of the aortic arch. No acute osseous pathology. IMPRESSION: No active disease. Electronically Signed   By: Anner Crete M.D.   On: 02/12/2022 21:57     Labs:   Basic Metabolic Panel: Recent Labs  Lab 02/12/22 2040 02/14/22 0807 02/15/22 0234 02/16/22 0205  NA 141 135 138 137  K 3.8 4.2 3.9 4.0  CL 108 105 105 110  CO2 23 22 23 $ 19*  GLUCOSE 140* 115* 109* 114*  BUN 18 17 19 17  $ CREATININE 1.21 1.02 1.10 1.11  CALCIUM 8.9 9.1 8.9 8.9   GFR Estimated Creatinine Clearance: 78.9 mL/min (by C-G formula based on SCr of 1.11 mg/dL). Liver Function Tests: No results for input(s): "AST", "ALT", "ALKPHOS", "BILITOT", "PROT", "ALBUMIN" in the last 168 hours. No results for input(s): "LIPASE", "AMYLASE" in the last 168 hours. No results for input(s): "AMMONIA" in the last 168 hours. Coagulation  profile Recent Labs  Lab 02/12/22 2240  INR 1.0    CBC: Recent Labs  Lab 02/12/22 2040 02/14/22 0216 02/15/22 0234 02/16/22 0205  WBC 7.9 9.6 8.5 9.0  HGB 13.5 12.9* 12.7* 13.1  HCT 42.2 37.7* 37.2* 38.0*  MCV 89.0 85.5 85.7 86.6  PLT 285 266 255 258   Cardiac Enzymes: No results for input(s): "CKTOTAL", "CKMB", "CKMBINDEX", "TROPONINI" in the last 168 hours. BNP: Invalid input(s): "POCBNP" CBG: Recent Labs  Lab 02/13/22 0208 02/13/22 0822 02/13/22 1226  GLUCAP 124* 96 158*   D-Dimer No results for input(s): "DDIMER" in the last 72 hours. Hgb A1c No results for input(s): "HGBA1C" in the last 72 hours. Lipid Profile No results for input(s): "CHOL", "HDL", "LDLCALC", "TRIG", "CHOLHDL", "LDLDIRECT" in the last 72 hours. Thyroid function studies No results for input(s): "TSH", "T4TOTAL", "T3FREE", "THYROIDAB" in the last 72 hours.  Invalid input(s): "FREET3" Anemia work up No results for input(s): "VITAMINB12", "FOLATE", "FERRITIN", "TIBC", "IRON", "RETICCTPCT" in the last 72 hours. Microbiology No results found for this or any previous visit (from the past 240 hour(s)).   Discharge Instructions:   Discharge Instructions     AMB Referral to Cardiac Rehabilitation - Phase II   Complete by: As directed    Diagnosis:  Coronary Stents NSTEMI     After initial evaluation and assessments completed: Virtual Based Care may be provided alone or in conjunction with Phase 2 Cardiac Rehab based on patient barriers.: Yes   Intensive Cardiac Rehabilitation (ICR) [place] location only OR Traditional Cardiac Rehabilitation (TCR) *If criteria for ICR are not met will enroll in TCR [facility] only): Yes   Diet - low sodium heart healthy   Complete by: As directed    Increase activity slowly   Complete by: As directed       Allergies as of 02/16/2022   No Known Allergies      Medication List     STOP taking these medications    Dexilant 60 MG capsule Generic drug:  dexlansoprazole   ESTER C PO       TAKE these medications    AMBULATORY NON FORMULARY MEDICATION Medication Name: Diltiazem 2%/ Lidocaine 5% Using your index finger, apply a small amount of medication inside the rectum up to your first knuckle/joint three times daily x 6-8 weeks.   aspirin EC 81 MG tablet Take 1  tablet (81 mg total) by mouth daily. Swallow whole. Start taking on: February 17, 2022   carvedilol 6.25 MG tablet Commonly known as: COREG Take 1 tablet (6.25 mg total) by mouth 2 (two) times daily with a meal.   cyanocobalamin 500 MCG tablet Commonly known as: VITAMIN B12 Take 500 mcg by mouth daily.   nitroGLYCERIN 0.4 MG SL tablet Commonly known as: NITROSTAT Place 1 tablet (0.4 mg total) under the tongue every 5 (five) minutes as needed for chest pain.   pantoprazole 40 MG tablet Commonly known as: PROTONIX Take 40 mg by mouth daily.   rosuvastatin 20 MG tablet Commonly known as: CRESTOR Take 1 tablet (20 mg total) by mouth daily. Start taking on: February 17, 2022 What changed:  medication strength how much to take   ticagrelor 90 MG Tabs tablet Commonly known as: BRILINTA Take 1 tablet (90 mg total) by mouth 2 (two) times daily.   vitamin C 1000 MG tablet Take 1,000 mg by mouth daily.   Vitamin D3 25 MCG (1000 UT) capsule Take 1,000 Units by mouth daily.        Follow-up Information     Isaiah Serge, NP Follow up on 02/26/2022.   Specialties: Cardiology, Radiology Why: at 2:30pm for your follow up appt Contact information: [place] [address] [phone]                  Time coordinating discharge: 45 min  Signed:  Geradine Girt DO  [facility] 02/16/2022, 9:15 AM

## 2022-02-16 NOTE — Plan of Care (Signed)
  Problem: Education: Goal: Ability to describe self-care measures that may prevent or decrease complications (Diabetes Survival Skills Education) will improve Outcome: Adequate for Discharge Goal: Individualized Educational Video(s) Outcome: Adequate for Discharge   Problem: Coping: Goal: Ability to adjust to condition or change in health will improve Outcome: Adequate for Discharge   Problem: Fluid Volume: Goal: Ability to maintain a balanced intake and output will improve Outcome: Adequate for Discharge   Problem: Health Behavior/Discharge Planning: Goal: Ability to identify and utilize available resources and services will improve Outcome: Adequate for Discharge Goal: Ability to manage health-related needs will improve Outcome: Adequate for Discharge   Problem: Metabolic: Goal: Ability to maintain appropriate glucose levels will improve Outcome: Adequate for Discharge   Problem: Nutritional: Goal: Maintenance of adequate nutrition will improve Outcome: Adequate for Discharge Goal: Progress toward achieving an optimal weight will improve Outcome: Adequate for Discharge   Problem: Skin Integrity: Goal: Risk for impaired skin integrity will decrease Outcome: Adequate for Discharge   Problem: Tissue Perfusion: Goal: Adequacy of tissue perfusion will improve Outcome: Adequate for Discharge   Problem: Education: Goal: Knowledge of General Education information will improve Description: Including pain rating scale, medication(s)/side effects and non-pharmacologic comfort measures Outcome: Adequate for Discharge   Problem: Health Behavior/Discharge Planning: Goal: Ability to manage health-related needs will improve Outcome: Adequate for Discharge   Problem: Clinical Measurements: Goal: Ability to maintain clinical measurements within normal limits will improve Outcome: Adequate for Discharge Goal: Will remain free from infection Outcome: Adequate for Discharge Goal:  Diagnostic test results will improve Outcome: Adequate for Discharge Goal: Respiratory complications will improve Outcome: Adequate for Discharge Goal: Cardiovascular complication will be avoided Outcome: Adequate for Discharge   Problem: Activity: Goal: Risk for activity intolerance will decrease Outcome: Adequate for Discharge   Problem: Nutrition: Goal: Adequate nutrition will be maintained Outcome: Adequate for Discharge   Problem: Coping: Goal: Level of anxiety will decrease Outcome: Adequate for Discharge   Problem: Elimination: Goal: Will not experience complications related to bowel motility Outcome: Adequate for Discharge Goal: Will not experience complications related to urinary retention Outcome: Adequate for Discharge   Problem: Pain Managment: Goal: General experience of comfort will improve Outcome: Adequate for Discharge   Problem: Safety: Goal: Ability to remain free from injury will improve Outcome: Adequate for Discharge   Problem: Skin Integrity: Goal: Risk for impaired skin integrity will decrease Outcome: Adequate for Discharge   Problem: Education: Goal: Understanding of CV disease, CV risk reduction, and recovery process will improve Outcome: Adequate for Discharge Goal: Individualized Educational Video(s) Outcome: Adequate for Discharge   Problem: Activity: Goal: Ability to return to baseline activity level will improve Outcome: Adequate for Discharge   Problem: Cardiovascular: Goal: Ability to achieve and maintain adequate cardiovascular perfusion will improve Outcome: Adequate for Discharge Goal: Vascular access site(s) Level 0-1 will be maintained Outcome: Adequate for Discharge   Problem: Health Behavior/Discharge Planning: Goal: Ability to safely manage health-related needs after discharge will improve Outcome: Adequate for Discharge   

## 2022-02-25 NOTE — Progress Notes (Signed)
Cardiology Office Note   Date:  02/26/2022   ID:  Rajan, Leser 07-08-1955, MRN [id number]  PCP:  Sharilyn Sites, MD  Cardiologist:  Dr. Dellia Cloud    Chief Complaint  Patient presents with   post hosptialization    NSTEMI       History of Present Illness: Peter Williams is a 67 y.o. male who presents for post hospitalization follow up.  Pt with no prior hx of CAD did have hx of GERD pt developed chest pain 02/12/22 mid sternal with radiation to his jaw and left ARM. Pain would ebb and flow until it became severe and he presented to ER   BP was elevated in 140-160s, EKG with early R wave progression, no ST elevation but hs troponin 1061. With pk of 1818 Placed on IV heparin and transferred to [facility] form [facility]   Cardiac cath with 1st mrg 90% stenosis DES placed.  And balloon PTCA with jailing of medial branch. Of OM  EF 55-65%  Echo EF 60-65%, nor RWMA mild LVH   Discharged with ASA, Brilinta coreg and statin Crestor 20    LDL was 116, HDL 53 and Lpa 8.9   Today he feels great no chest pain, occ headache.  No SOB.  Is waking at least a mile per day and does not believe he will go to cardiac rehab.  He plans to return to work on [employer] in [employer].  We discussed another week.  He is taking meds without issues.  No bleeding. Questions answered.    Past Medical History:  Diagnosis Date   Bruit of right carotid artery    patient unaware   DDD (degenerative disc disease), lumbosacral    Gastritis    GERD (gastroesophageal reflux disease)    Peptic ulcer disease     Past Surgical History:  Procedure Laterality Date   BACK SURGERY     S5 and L3 vertabrae repair   BILROTH II PROCEDURE  1981   bilroth II, revised with bile-reflux bypass   COLONOSCOPY     CORONARY BALLOON ANGIOPLASTY N/A 02/15/2022   Procedure: CORONARY BALLOON ANGIOPLASTY;  Surgeon: Jettie Booze, MD;  Location: [facility];  Service: Cardiovascular;  Laterality: N/A;   CORONARY STENT INTERVENTION  N/A 02/15/2022   Procedure: CORONARY STENT INTERVENTION;  Surgeon: Jettie Booze, MD;  Location: [facility];  Service: Cardiovascular;  Laterality: N/A;   ESOPHAGOGASTRODUODENOSCOPY     INCISIONAL HERNIA REPAIR N/A 06/09/2017   Procedure: LAPAROSCOPIC VENTRAL INCISIONAL HERNIA REPAIR WITH MESH;  Surgeon: Alphonsa Overall, MD;  Location: [facility];  Service: General;  Laterality: N/A;   INSERTION OF MESH N/A 06/09/2017   Procedure: INSERTION OF MESH;  Surgeon: Alphonsa Overall, MD;  Location: [facility];  Service: General;  Laterality: N/A;   LEFT HEART CATH AND CORONARY ANGIOGRAPHY N/A 02/15/2022   Procedure: LEFT HEART CATH AND CORONARY ANGIOGRAPHY;  Surgeon: Jettie Booze, MD;  Location: [facility];  Service: Cardiovascular;  Laterality: N/A;     Current Outpatient Medications  Medication Sig Dispense Refill   Ascorbic Acid (VITAMIN C) 1000 MG tablet Take 1,000 mg by mouth daily.     aspirin EC 81 MG tablet Take 1 tablet (81 mg total) by mouth daily. Swallow whole. 30 tablet 12   carvedilol (COREG) 6.25 MG tablet Take 1 tablet (6.25 mg total) by mouth 2 (two) times daily with a meal. 60 tablet 1   Cholecalciferol (VITAMIN D3) 1000  units CAPS Take 1,000 Units by mouth daily.     cyanocobalamin (VITAMIN B12) 500 MCG tablet Take 500 mcg by mouth daily.     nitroGLYCERIN (NITROSTAT) 0.4 MG SL tablet Place 1 tablet (0.4 mg total) under the tongue every 5 (five) minutes as needed for chest pain. 25 tablet 1   pantoprazole (PROTONIX) 40 MG tablet Take 40 mg by mouth daily.     rosuvastatin (CRESTOR) 20 MG tablet Take 1 tablet (20 mg total) by mouth daily. 30 tablet 0   ticagrelor (BRILINTA) 90 MG TABS tablet Take 1 tablet (90 mg total) by mouth 2 (two) times daily. 60 tablet 1   AMBULATORY NON FORMULARY MEDICATION Medication Name: Diltiazem 2%/ Lidocaine 5% Using your index finger, apply a small amount of medication inside the rectum up to your first knuckle/joint three times daily x  6-8 weeks. (Patient not taking: Reported on 02/13/2022) 30 g 1   No current facility-administered medications for this visit.    Allergies:   Patient has no known allergies.    Social History:  The patient  reports that he quit smoking about 15 years ago. His smoking use included cigarettes. He has a 24.00 pack-year smoking history. He has never used smokeless tobacco. He reports that he does not drink alcohol and does not use drugs.   Family History:  The patient's family history includes Alzheimer's disease in his mother; Diabetes in his brother.    ROS:  General:no colds or fevers, no weight changes Skin:no rashes or ulcers HEENT:no blurred vision, no congestion CV:see HPI PUL:see HPI GI:no diarrhea constipation or melena, no indigestion GU:no hematuria, no dysuria MS:no joint pain, no claudication Neuro:no syncope, no lightheadedness  occ headache  Endo:no diabetes, no thyroid disease  Wt Readings from Last 3 Encounters:  02/26/22 209 lb (94.8 kg)  02/12/22 213 lb (96.6 kg)  06/29/21 209 lb (94.8 kg)     PHYSICAL EXAM: VS:  BP 124/66   Pulse 65   Ht 6' (1.829 m)   Wt 209 lb (94.8 kg)   SpO2 94%   BMI 28.35 kg/m  , BMI Body mass index is 28.35 kg/m. General:Pleasant affect, NAD Skin:Warm and dry, brisk capillary refill HEENT:normocephalic, sclera clear, mucus membranes moist Neck:supple, no JVD, no bruits  Heart:S1S2 RRR without murmur, gallup, rub or click  rt wrist stable no hematoma, healing very well Lungs:clear without rales, rhonchi, or wheezes [id number], non tender, + BS, do not palpate liver spleen or masses Ext:no lower ext edema, 2+ pedal pulses, 2+ radial pulses Neuro:alert and oriented X 3, MAE, follows commands, + facial symmetry    EKG:  EKG is NOT  ordered today.   Recent Labs: 02/16/2022: BUN 17; Creatinine, Ser 1.11; Hemoglobin 13.1; Platelets 258; Potassium 4.0; Sodium 137    Lipid Panel    Component Value Date/Time   CHOL 187 02/13/2022  0452   TRIG 92 02/13/2022 0452   HDL 53 02/13/2022 0452   CHOLHDL 3.5 02/13/2022 0452   VLDL 18 02/13/2022 0452   LDLCALC 116 (H) 02/13/2022 0452       Other studies Reviewed: Additional studies/ records that were reviewed today include: . Cath: 02/15/2022     Ost LAD to Prox LAD lesion is 25% stenosed.   Mid Cx lesion is 25% stenosed.   1st Mrg-1 lesion is 90% stenosed.  A drug-eluting stent was successfully placed using a SYNERGY XD 2.50X20, postdilated to greater than 3 mm in diameter.  The stent started in the  OM proximal to the bifurcation and extended into the lateral branch of the OM, jailing the more medial branch.   Post intervention, there is a 0% residual stenosis.   Balloon angioplasty was performed using a BALLN SAPPHIRE 2.0X12 through the stent struts to restore flow to this branch.   Post intervention, there is a 10% residual stenosis.   The left ventricular systolic function is normal.   LV end diastolic pressure is normal.   The left ventricular ejection fraction is 55-65% by visual estimate.   There is no aortic valve stenosis.   Successful PCI of the bifurcation lesion in the OM1 with stent to the main branch and balloon angioplasty to the smaller more medial branch of the OM1.  Continue dual antiplatelet therapy along with aggressive secondary prevention.     Results conveyed to the patient's wife, Butch Penny.    Diagnostic Dominance: Co-dominant  Intervention     Echo 02/13/22 IMPRESSIONS     1. Left ventricular ejection fraction, by estimation, is 60 to 65%. The  left ventricle has normal function. The left ventricle has no regional  wall motion abnormalities. There is mild left ventricular hypertrophy.  Left ventricular diastolic parameters  were normal.   2. Right ventricular systolic function is normal. The right ventricular  size is normal. Tricuspid regurgitation signal is inadequate for assessing  PA pressure.   3. The mitral valve is normal in  structure. No evidence of mitral valve  regurgitation. No evidence of mitral stenosis.   4. The aortic valve was not well visualized. Aortic valve regurgitation  is not visualized. No aortic stenosis is present.   5. The inferior vena cava is normal in size with greater than 50%  respiratory variability, suggesting right atrial pressure of 3 mmHg.   Comparison(s): No prior Echocardiogram.   FINDINGS   Left Ventricle: Left ventricular ejection fraction, by estimation, is 60  to 65%. The left ventricle has normal function. The left ventricle has no  regional wall motion abnormalities. Definity contrast agent was given IV  to delineate the left ventricular   endocardial borders. The left ventricular internal cavity size was normal  in size. There is mild left ventricular hypertrophy. Left ventricular  diastolic parameters were normal.   Right Ventricle: The right ventricular size is normal. No increase in  right ventricular wall thickness. Right ventricular systolic function is  normal. Tricuspid regurgitation signal is inadequate for assessing PA  pressure.   Left Atrium: Left atrial size was normal in size.   Right Atrium: Right atrial size was normal in size.   Pericardium: There is no evidence of pericardial effusion.   Mitral Valve: The mitral valve is normal in structure. No evidence of  mitral valve regurgitation. No evidence of mitral valve stenosis.   Tricuspid Valve: The tricuspid valve is not well visualized. Tricuspid  valve regurgitation is not demonstrated. No evidence of tricuspid  stenosis.   Aortic Valve: The aortic valve was not well visualized. Aortic valve  regurgitation is not visualized. No aortic stenosis is present. Aortic  valve mean gradient measures 2.0 mmHg. Aortic valve peak gradient measures  5.2 mmHg. Aortic valve area, by VTI  measures 2.07 cm.   Pulmonic Valve: The pulmonic valve was not well visualized. Pulmonic valve  regurgitation is not  visualized. No evidence of pulmonic stenosis.   Aorta: The aortic root and ascending aorta are structurally normal, with  no evidence of dilitation.   Venous: The inferior vena cava is normal in size  with greater than 50%  respiratory variability, suggesting right atrial pressure of 3 mmHg.    ASSESSMENT AND PLAN:  1.  CAD/NSTEMI with pk of hs troponin 1818 - cath with 90% 1st OM treated with PCI/DES and extended into the lateral branch of OM -jailing more medial branch treated with PTCA. DAPT for 1 year at least - tolerating ASA and Brilinta without issue except occ H/A.  E 60-65% on RWMA  he is on coreg 6.25 BID as well and statin  pt may return to work March 4 without restrictions, if he has problems with fatigue he will call and we can adjust.   Hospital notes reviewed and labs and tests  2.  HLD on crestor 20 up form 5 mg as outpt will recheck Hepatic and lipids in 6-8 weeks.  3.  HTN excellent today on coreg  4.  GERD on PPI and no reflux    Current medicines are reviewed with the patient today.  The patient Has no concerns regarding medicines.  The following changes have been made:  See above Labs/ tests ordered today include:see above  Disposition:   FU:  see above  Signed, Cecilie Kicks, NP  02/26/2022 5:49 PM    [facility] [place], [place], [place], [place] Phone: [phone]; Fax: [phone]

## 2022-02-26 ENCOUNTER — Encounter: Payer: Self-pay | Admitting: Cardiology

## 2022-02-26 ENCOUNTER — Ambulatory Visit: Payer: Medicare Other | Attending: Cardiology | Admitting: Cardiology

## 2022-02-26 VITALS — BP 124/66 | HR 65 | Ht 72.0 in | Wt 209.0 lb

## 2022-02-26 DIAGNOSIS — I2510 Atherosclerotic heart disease of native coronary artery without angina pectoris: Secondary | ICD-10-CM

## 2022-02-26 DIAGNOSIS — I214 Non-ST elevation (NSTEMI) myocardial infarction: Secondary | ICD-10-CM

## 2022-02-26 DIAGNOSIS — E782 Mixed hyperlipidemia: Secondary | ICD-10-CM | POA: Diagnosis not present

## 2022-02-26 DIAGNOSIS — Z13220 Encounter for screening for lipoid disorders: Secondary | ICD-10-CM

## 2022-02-26 DIAGNOSIS — K219 Gastro-esophageal reflux disease without esophagitis: Secondary | ICD-10-CM | POA: Diagnosis not present

## 2022-02-26 DIAGNOSIS — I10 Essential (primary) hypertension: Secondary | ICD-10-CM | POA: Diagnosis not present

## 2022-02-26 NOTE — Patient Instructions (Signed)
Medication Instructions:  Your physician recommends that you continue on your current medications as directed. Please refer to the Current Medication list given to you today.  *If you need a refill on your cardiac medications before your next appointment, please call your pharmacy*   Lab Work: Your physician recommends that you return for lab work in: 6 Weeks ( 04/08/22) Lipids, Liver (Fasting )    If you have labs (blood work) drawn today and your tests are completely normal, you will receive your results only by: [place] (if you have MyChart) OR A paper copy in the mail If you have any lab test that is abnormal or we need to change your treatment, we will call you to review the results.   Testing/Procedures: NONE    Follow-Up: At [facility], you and your health needs are our priority.  As part of our continuing mission to provide you with exceptional heart care, we have created designated Provider Care Teams.  These Care Teams include your primary Cardiologist (physician) and Advanced Practice Providers (APPs -  Physician Assistants and Nurse Practitioners) who all work together to provide you with the care you need, when you need it.  We recommend signing up for the patient portal called "MyChart".  Sign up information is provided on this After Visit Summary.  MyChart is used to connect with patients for Virtual Visits (Telemedicine).  Patients are able to view lab/test results, encounter notes, upcoming appointments, etc.  Non-urgent messages can be sent to your provider as well.   To learn more about what you can do with MyChart, go to [email or web address].    Your next appointment:   2 month(s)  Provider:   Claudina Lick, MD    Other Instructions Thank you for choosing [facility]!

## 2022-03-04 ENCOUNTER — Encounter (HOSPITAL_COMMUNITY): Payer: Self-pay

## 2022-03-04 ENCOUNTER — Telehealth (HOSPITAL_COMMUNITY): Payer: Self-pay

## 2022-03-04 NOTE — Telephone Encounter (Signed)
Attempted to call patient in regards to Cardiac Rehab - LM on VM Mailed letter 

## 2022-03-04 NOTE — Telephone Encounter (Signed)
Pt returned CR phone call and stated he has return to work and is unable to participate at this time.   Closed referral

## 2022-03-13 ENCOUNTER — Other Ambulatory Visit (HOSPITAL_COMMUNITY): Payer: Self-pay

## 2022-03-19 ENCOUNTER — Other Ambulatory Visit (HOSPITAL_COMMUNITY): Payer: Self-pay

## 2022-04-11 ENCOUNTER — Other Ambulatory Visit (HOSPITAL_COMMUNITY): Payer: Self-pay

## 2022-04-13 ENCOUNTER — Encounter: Payer: Self-pay | Admitting: Interventional Cardiology

## 2022-04-13 ENCOUNTER — Telehealth: Payer: Self-pay | Admitting: Internal Medicine

## 2022-04-13 NOTE — Telephone Encounter (Signed)
Patient is having serious allergies due to pollen and was told to consult cardiology before taking otc allergy medication. Pt's spouse would like to know what cardiology recommends for allergies.   Please advise.

## 2022-04-13 NOTE — Telephone Encounter (Signed)
Error

## 2022-04-13 NOTE — Telephone Encounter (Signed)
Pt c/o medication issue:  1. Name of Medication: Allergy medication   2. How are you currently taking this medication (dosage and times per day)?   3. Are you having a reaction (difficulty breathing--STAT)?   4. What is your medication issue? Patient's wife is requesting call back to discuss allergy medication and what would be safe to take. Please advise.

## 2022-04-14 NOTE — Telephone Encounter (Signed)
Called to discuss OTC allergy medications. LVM Safe to take OTC antihistamine e.g. Zyrtec, Allegra or even non- oral like Flonase nasal spray or saline nasal spray.

## 2022-04-14 NOTE — Telephone Encounter (Signed)
Patient states he would like to know what OTC medication he can take for a cough.  Patient stated he is at work and can call his wife, [name], at [phone] or can leave VM.

## 2022-04-14 NOTE — Telephone Encounter (Signed)
Patient notified and verbalized understanding. Patient had no further questions or concerns at this time.  

## 2022-04-14 NOTE — Telephone Encounter (Signed)
Patient can try Delsym

## 2022-04-16 ENCOUNTER — Telehealth: Payer: Self-pay | Admitting: Internal Medicine

## 2022-04-16 ENCOUNTER — Other Ambulatory Visit: Payer: Self-pay

## 2022-04-16 MED ORDER — CARVEDILOL 6.25 MG PO TABS: 6.2500 mg | ORAL_TABLET | Freq: Two times a day (BID) | ORAL | 0 refills | Status: DC

## 2022-04-16 MED ORDER — TICAGRELOR 90 MG PO TABS: 90.0000 mg | ORAL_TABLET | Freq: Two times a day (BID) | ORAL | 0 refills | Status: DC

## 2022-04-16 NOTE — Telephone Encounter (Signed)
Patient has said he called the pharmacy a couple time at the [facility] about refills on Carvedilol 6.25mg  and Brilinta 90mg  he said he is about to run out. Marlin Canary prescribed them originally in the hospital he needs refills ASAP is asking if they can be sent today to [facility]

## 2022-04-16 NOTE — Telephone Encounter (Signed)
After speaking with patient, pharmacy changed to [facility]  E-scribed 30 day supply og both coreg and Brilinta, pt has f/u in office on 4/26

## 2022-04-30 ENCOUNTER — Encounter: Payer: Self-pay | Admitting: Internal Medicine

## 2022-04-30 ENCOUNTER — Ambulatory Visit: Payer: Medicare Other | Attending: Internal Medicine | Admitting: Internal Medicine

## 2022-04-30 VITALS — BP 142/64 | HR 76 | Ht 72.0 in | Wt 212.4 lb

## 2022-04-30 DIAGNOSIS — E7849 Other hyperlipidemia: Secondary | ICD-10-CM | POA: Diagnosis not present

## 2022-04-30 DIAGNOSIS — I2510 Atherosclerotic heart disease of native coronary artery without angina pectoris: Secondary | ICD-10-CM | POA: Diagnosis not present

## 2022-04-30 DIAGNOSIS — E785 Hyperlipidemia, unspecified: Secondary | ICD-10-CM | POA: Insufficient documentation

## 2022-04-30 MED ORDER — CARVEDILOL 6.25 MG PO TABS: 6.2500 mg | ORAL_TABLET | Freq: Two times a day (BID) | ORAL | 3 refills | Status: DC

## 2022-04-30 MED ORDER — ASPIRIN 81 MG PO TBEC: 81.0000 mg | DELAYED_RELEASE_TABLET | Freq: Every day | ORAL | 3 refills | Status: DC

## 2022-04-30 MED ORDER — TICAGRELOR 90 MG PO TABS: 90.0000 mg | ORAL_TABLET | Freq: Two times a day (BID) | ORAL | 3 refills | Status: DC

## 2022-04-30 MED ORDER — ROSUVASTATIN CALCIUM 20 MG PO TABS: 20.0000 mg | ORAL_TABLET | Freq: Every day | ORAL | 3 refills | Status: DC

## 2022-04-30 NOTE — Patient Instructions (Signed)
Medication Instructions:  Your physician recommends that you continue on your current medications as directed. Please refer to the Current Medication list given to you today.  *If you need a refill on your cardiac medications before your next appointment, please call your pharmacy*   Lab Work: None If you have labs (blood work) drawn today and your tests are completely normal, you will receive your results only by: MyChart Message (if you have MyChart) OR A paper copy in the mail If you have any lab test that is abnormal or we need to change your treatment, we will call you to review the results.   Testing/Procedures: None   Follow-Up: At [facility], you and your health needs are our priority.  As part of our continuing mission to provide you with exceptional heart care, we have created designated Provider Care Teams.  These Care Teams include your primary Cardiologist (physician) and Advanced Practice Providers (APPs -  Physician Assistants and Nurse Practitioners) who all work together to provide you with the care you need, when you need it.  We recommend signing up for the patient portal called "MyChart".  Sign up information is provided on this After Visit Summary.  MyChart is used to connect with patients for Virtual Visits (Telemedicine).  Patients are able to view lab/test results, encounter notes, upcoming appointments, etc.  Non-urgent messages can be sent to your provider as well.   To learn more about what you can do with MyChart, go to [email or web address].    Your next appointment:   10 month(s)  Provider:   Luane School, MD    Other Instructions

## 2022-04-30 NOTE — Progress Notes (Signed)
Cardiology Office Note  Date: 04/30/2022   ID: Peter Williams, Peter Williams 09-Jul-1955, MRN [id number]  PCP:  Assunta Found, MD  Cardiologist:  None Electrophysiologist:  None   Reason for Office Visit: Follow-up of CAD   History of Present Illness: Peter Williams is a 67 y.o. male known to have CAD manifested by NSTEMI in 02/2022 s/p OM PCI with normal LVEF, HLD is here for follow-up visit.  Patient had NSTEMI in 2/24 and LHC showed OM1 lesion. He underwent successful PCI of the bifurcation lesion in the OM1 with stent to the main branch and balloon angioplasty to the smaller more medial branch of the OM1. He is currently on DAPT, aspirin 81 mg once daily and Brilinta 90 mg twice daily. He received a 30-day supply of Brilinta upon hospital discharge in 02/2022. He ran out of Brilinta after 1 month and had to call the clinic to have them refill the prescription near [place], [place]. Otherwise, he did not miss any more doses of DAPT.  He denied any angina.  Has some SOB with strenuous physical activities but no SOB with usual ADLs or household chores.  Denies syncope, palpitations or leg swelling.  He stopped taking rosuvastatin because he believes he had a heart attack within 3 days of taking rosuvastatin.  Past Medical History:  Diagnosis Date   Bruit of right carotid artery    patient unaware   DDD (degenerative disc disease), lumbosacral    Gastritis    GERD (gastroesophageal reflux disease)    Peptic ulcer disease     Past Surgical History:  Procedure Laterality Date   BACK SURGERY     S5 and L3 vertabrae repair   BILROTH II PROCEDURE  1981   bilroth II, revised with bile-reflux bypass   COLONOSCOPY     CORONARY BALLOON ANGIOPLASTY N/A 02/15/2022   Procedure: CORONARY BALLOON ANGIOPLASTY;  Surgeon: Corky Crafts, MD;  Location: [facility] INVASIVE CV LAB;  Service: Cardiovascular;  Laterality: N/A;   CORONARY STENT INTERVENTION N/A 02/15/2022   Procedure: CORONARY STENT  INTERVENTION;  Surgeon: Corky Crafts, MD;  Location: [facility] INVASIVE CV LAB;  Service: Cardiovascular;  Laterality: N/A;   ESOPHAGOGASTRODUODENOSCOPY     INCISIONAL HERNIA REPAIR N/A 06/09/2017   Procedure: LAPAROSCOPIC VENTRAL INCISIONAL HERNIA REPAIR WITH MESH;  Surgeon: Ovidio Kin, MD;  Location: [facility] ORS;  Service: General;  Laterality: N/A;   INSERTION OF MESH N/A 06/09/2017   Procedure: INSERTION OF MESH;  Surgeon: Ovidio Kin, MD;  Location: [facility] ORS;  Service: General;  Laterality: N/A;   LEFT HEART CATH AND CORONARY ANGIOGRAPHY N/A 02/15/2022   Procedure: LEFT HEART CATH AND CORONARY ANGIOGRAPHY;  Surgeon: Corky Crafts, MD;  Location: [facility] INVASIVE CV LAB;  Service: Cardiovascular;  Laterality: N/A;    Current Outpatient Medications  Medication Sig Dispense Refill   Ascorbic Acid (VITAMIN C) 1000 MG tablet Take 1,000 mg by mouth daily.     Cholecalciferol (VITAMIN D3) 1000 units CAPS Take 1,000 Units by mouth daily.     cyanocobalamin (VITAMIN B12) 500 MCG tablet Take 500 mcg by mouth daily.     nitroGLYCERIN (NITROSTAT) 0.4 MG SL tablet Place 1 tablet (0.4 mg total) under the tongue every 5 (five) minutes as needed for chest pain. 25 tablet 1   pantoprazole (PROTONIX) 40 MG tablet Take 40 mg by mouth daily.     aspirin EC 81 MG tablet Take 1 tablet (81 mg total) by mouth daily. Swallow whole. 90 tablet  3   carvedilol (COREG) 6.25 MG tablet Take 1 tablet (6.25 mg total) by mouth 2 (two) times daily with a meal. 180 tablet 3   rosuvastatin (CRESTOR) 20 MG tablet Take 1 tablet (20 mg total) by mouth daily. 90 tablet 3   ticagrelor (BRILINTA) 90 MG TABS tablet Take 1 tablet (90 mg total) by mouth 2 (two) times daily. 180 tablet 3   No current facility-administered medications for this visit.   Allergies:  Patient has no known allergies.   Social History: The patient  reports that he quit smoking about 15 years ago. His smoking use included cigarettes. He has a 24.00 pack-year  smoking history. He has never used smokeless tobacco. He reports that he does not drink alcohol and does not use drugs.   Family History: The patient's family history includes Alzheimer's disease in his mother; Diabetes in his brother.   ROS:  Please see the history of present illness. Otherwise, complete review of systems is positive for none.  All other systems are reviewed and negative.   Physical Exam: VS:  BP (!) 142/64   Pulse 76   Ht 6' (1.829 m)   Wt 212 lb 6.4 oz (96.3 kg)   SpO2 96%   BMI 28.81 kg/m , BMI Body mass index is 28.81 kg/m.  Wt Readings from Last 3 Encounters:  04/30/22 212 lb 6.4 oz (96.3 kg)  02/26/22 209 lb (94.8 kg)  02/12/22 213 lb (96.6 kg)    General: Patient appears comfortable at rest. HEENT: Conjunctiva and lids normal, oropharynx clear with moist mucosa. Neck: Supple, no elevated JVP or carotid bruits, no thyromegaly. Lungs: Clear to auscultation, nonlabored breathing at rest. Cardiac: Regular rate and rhythm, no S3 or significant systolic murmur, no pericardial rub. Abdomen: Soft, nontender, no hepatomegaly, bowel sounds present, no guarding or rebound. Extremities: No pitting edema, distal pulses 2+. Skin: Warm and dry. Musculoskeletal: No kyphosis. Neuropsychiatric: Alert and oriented x3, affect grossly appropriate.  Recent Labwork: 02/16/2022: BUN 17; Creatinine, Ser 1.11; Hemoglobin 13.1; Platelets 258; Potassium 4.0; Sodium 137     Component Value Date/Time   CHOL 187 02/13/2022 0452   TRIG 92 02/13/2022 0452   HDL 53 02/13/2022 0452   CHOLHDL 3.5 02/13/2022 0452   VLDL 18 02/13/2022 0452   LDLCALC 116 (H) 02/13/2022 0452    Other Studies Reviewed Today: Echocardiogram in 02/2022 LVEF normal Mild LVH RV systolic function is normal No valvular abnormalities  Assessment and Plan: Patient is a 68 year old M known to have CAD manifested by NSTEMI in 02/2022 s/p OM PCI with normal LVEF, HLD is here for follow-up visit.  #  CAD  manifested by NSTEMI in 02/2022 s/p OM PCI with normal LVEF, currently angina free -Continue DAPT for total duration of 1 year, uninterrupted. Continue aspirin 81 mg once daily and Brilinta 90 mg twice daily. -Restart rosuvastatin 20 mg at bedtime -SL NTG 0.4 mg. -ER precautions for chest pain  # HLD, not at goal -Restart rosuvastatin 20 mg at bedtime -Goal LDL less than 70  # HTN, partially controlled -Continue carvedilol 6.5 mg daily -Does not check blood pressures at home, instructed him to get a blood pressure machine over-the-counter and check blood pressures in the a.m. at least for a few times a week.  Refilled all his medications  I have spent a total of 30 minutes with patient reviewing chart, EKGs, labs and examining patient as well as establishing an assessment and plan that was discussed with the patient.  >  50% of time was spent in direct patient care.    Medication Adjustments/Labs and Tests Ordered: Current medicines are reviewed at length with the patient today.  Concerns regarding medicines are outlined above.   Tests Ordered: No orders of the defined types were placed in this encounter.   Medication Changes: Meds ordered this encounter  Medications   aspirin EC 81 MG tablet    Sig: Take 1 tablet (81 mg total) by mouth daily. Swallow whole.    Dispense:  90 tablet    Refill:  3   carvedilol (COREG) 6.25 MG tablet    Sig: Take 1 tablet (6.25 mg total) by mouth 2 (two) times daily with a meal.    Dispense:  180 tablet    Refill:  3   rosuvastatin (CRESTOR) 20 MG tablet    Sig: Take 1 tablet (20 mg total) by mouth daily.    Dispense:  90 tablet    Refill:  3   ticagrelor (BRILINTA) 90 MG TABS tablet    Sig: Take 1 tablet (90 mg total) by mouth 2 (two) times daily.    Dispense:  180 tablet    Refill:  3    Disposition:  Follow up  10 months  Signed, Nigel Wessman Verne Spurr, MD, 04/30/2022 2:38 PM    [facility] [address] [place], [place] [address]

## 2022-05-14 DIAGNOSIS — B354 Tinea corporis: Secondary | ICD-10-CM | POA: Diagnosis not present

## 2022-05-14 DIAGNOSIS — I2510 Atherosclerotic heart disease of native coronary artery without angina pectoris: Secondary | ICD-10-CM | POA: Diagnosis not present

## 2022-05-14 DIAGNOSIS — Z955 Presence of coronary angioplasty implant and graft: Secondary | ICD-10-CM | POA: Diagnosis not present

## 2022-05-20 DIAGNOSIS — S90811A Abrasion, right foot, initial encounter: Secondary | ICD-10-CM | POA: Diagnosis not present

## 2022-05-20 DIAGNOSIS — M79671 Pain in right foot: Secondary | ICD-10-CM | POA: Diagnosis not present

## 2022-05-24 DIAGNOSIS — S9031XA Contusion of right foot, initial encounter: Secondary | ICD-10-CM | POA: Diagnosis not present

## 2022-05-24 DIAGNOSIS — S93491A Sprain of other ligament of right ankle, initial encounter: Secondary | ICD-10-CM | POA: Diagnosis not present

## 2022-05-27 DIAGNOSIS — M25571 Pain in right ankle and joints of right foot: Secondary | ICD-10-CM | POA: Diagnosis not present

## 2022-05-27 DIAGNOSIS — M79671 Pain in right foot: Secondary | ICD-10-CM | POA: Diagnosis not present

## 2022-06-01 DIAGNOSIS — S93491A Sprain of other ligament of right ankle, initial encounter: Secondary | ICD-10-CM | POA: Diagnosis not present

## 2022-06-01 DIAGNOSIS — S9031XA Contusion of right foot, initial encounter: Secondary | ICD-10-CM | POA: Diagnosis not present

## 2022-07-09 DIAGNOSIS — S93491A Sprain of other ligament of right ankle, initial encounter: Secondary | ICD-10-CM | POA: Diagnosis not present

## 2022-07-09 DIAGNOSIS — M25571 Pain in right ankle and joints of right foot: Secondary | ICD-10-CM | POA: Diagnosis not present

## 2022-07-09 DIAGNOSIS — M79671 Pain in right foot: Secondary | ICD-10-CM | POA: Diagnosis not present

## 2022-07-09 DIAGNOSIS — S9031XA Contusion of right foot, initial encounter: Secondary | ICD-10-CM | POA: Diagnosis not present

## 2023-02-04 ENCOUNTER — Encounter: Payer: Self-pay | Admitting: Internal Medicine

## 2023-02-04 ENCOUNTER — Ambulatory Visit: Payer: Medicare Other | Attending: Internal Medicine | Admitting: Internal Medicine

## 2023-02-04 VITALS — BP 153/73 | HR 78 | Ht 72.0 in | Wt 215.6 lb

## 2023-02-04 DIAGNOSIS — R0602 Shortness of breath: Secondary | ICD-10-CM | POA: Diagnosis not present

## 2023-02-04 DIAGNOSIS — I2510 Atherosclerotic heart disease of native coronary artery without angina pectoris: Secondary | ICD-10-CM | POA: Diagnosis not present

## 2023-02-04 MED ORDER — ROSUVASTATIN CALCIUM 20 MG PO TABS: 20.0000 mg | ORAL_TABLET | Freq: Every day | ORAL | 3 refills | Status: DC

## 2023-02-04 NOTE — Patient Instructions (Signed)
Medication Instructions:   Your physician recommends that you continue on your current medications as directed. Please refer to the Current Medication list given to you today.   STOP BRILINTA ON 02/17/23   Labwork: NONE TODAY  Testing/Procedures: NONE TODAY  Follow-Up:  1 YEAR  Any Other Special Instructions Will Be Listed Below (If Applicable).  If you need a refill on your cardiac medications before your next appointment, please call your pharmacy.

## 2023-02-04 NOTE — Progress Notes (Signed)
Cardiology Office Note  Date: 02/04/2023   ID: Williams, Peter [birth year], MRN [id number]  PCP:  Assunta Found, MD  Cardiologist:  Marjo Bicker, MD Electrophysiologist:  None    History of Present Illness: Peter Williams is a 68 y.o. male known to have CAD manifested by NSTEMI in 02/2022 s/p OM PCI with normal LVEF, HLD is here for follow-up visit.  Patient had NSTEMI in 2/24 and LHC showed OM1 lesion. He underwent successful PCI of the bifurcation lesion in the OM1 with stent to the main branch and balloon angioplasty to the smaller more medial branch of the OM1. He is currently on DAPT, aspirin 81 mg once daily and Brilinta 90 mg twice daily. He received a 30-day supply of Brilinta upon hospital discharge in 02/2022. He ran out of Brilinta after 1 month and had to call the clinic to have them refill the prescription near [place], [place].  He is here for follow-up visit.  Rosuvastatin 20 mg was restarted in the last clinic visit but patient stopped taking it as his heart attack happened 5 months after statin was started.  He also noticed worsening shortness of breath after starting Brilinta.  His last dose will be on 02/16/2023.  No angina.  No leg swelling, palpitations, dizziness, syncope.   Past Medical History:  Diagnosis Date   Bruit of right carotid artery    patient unaware   DDD (degenerative disc disease), lumbosacral    Gastritis    GERD (gastroesophageal reflux disease)    Peptic ulcer disease     Past Surgical History:  Procedure Laterality Date   BACK SURGERY     S5 and L3 vertabrae repair   BILROTH II PROCEDURE  1981   bilroth II, revised with bile-reflux bypass   COLONOSCOPY     CORONARY BALLOON ANGIOPLASTY N/A 02/15/2022   Procedure: CORONARY BALLOON ANGIOPLASTY;  Surgeon: Corky Crafts, MD;  Location: [facility] INVASIVE CV LAB;  Service: Cardiovascular;  Laterality: N/A;   CORONARY STENT INTERVENTION N/A 02/15/2022   Procedure: CORONARY STENT  INTERVENTION;  Surgeon: Corky Crafts, MD;  Location: [facility] INVASIVE CV LAB;  Service: Cardiovascular;  Laterality: N/A;   ESOPHAGOGASTRODUODENOSCOPY     INCISIONAL HERNIA REPAIR N/A 06/09/2017   Procedure: LAPAROSCOPIC VENTRAL INCISIONAL HERNIA REPAIR WITH MESH;  Surgeon: Ovidio Kin, MD;  Location: [facility] ORS;  Service: General;  Laterality: N/A;   INSERTION OF MESH N/A 06/09/2017   Procedure: INSERTION OF MESH;  Surgeon: Ovidio Kin, MD;  Location: [facility] ORS;  Service: General;  Laterality: N/A;   LEFT HEART CATH AND CORONARY ANGIOGRAPHY N/A 02/15/2022   Procedure: LEFT HEART CATH AND CORONARY ANGIOGRAPHY;  Surgeon: Corky Crafts, MD;  Location: [facility] INVASIVE CV LAB;  Service: Cardiovascular;  Laterality: N/A;    Current Outpatient Medications  Medication Sig Dispense Refill   Ascorbic Acid (VITAMIN C) 1000 MG tablet Take 1,000 mg by mouth daily.     aspirin EC 81 MG tablet Take 1 tablet (81 mg total) by mouth daily. Swallow whole. 90 tablet 3   carvedilol (COREG) 6.25 MG tablet Take 1 tablet (6.25 mg total) by mouth 2 (two) times daily with a meal. 180 tablet 3   Cholecalciferol (VITAMIN D3) 1000 units CAPS Take 1,000 Units by mouth daily.     cyanocobalamin (VITAMIN B12) 500 MCG tablet Take 500 mcg by mouth daily.     nitroGLYCERIN (NITROSTAT) 0.4 MG SL tablet Place 1 tablet (0.4 mg total) under the tongue  every 5 (five) minutes as needed for chest pain. 25 tablet 1   pantoprazole (PROTONIX) 40 MG tablet Take 40 mg by mouth daily.     ticagrelor (BRILINTA) 90 MG TABS tablet Take 1 tablet (90 mg total) by mouth 2 (two) times daily. 180 tablet 3   rosuvastatin (CRESTOR) 20 MG tablet Take 1 tablet (20 mg total) by mouth daily. (Patient not taking: Reported on 02/04/2023) 90 tablet 3   No current facility-administered medications for this visit.   Allergies:  Patient has no known allergies.   Social History: The patient  reports that he quit smoking about 16 years ago. His smoking use  included cigarettes. He started smoking about 24 years ago. He has a 24 pack-year smoking history. He has never used smokeless tobacco. He reports that he does not drink alcohol and does not use drugs.   Family History: The patient's family history includes Alzheimer's disease in his mother; Diabetes in his brother.   ROS:  Please see the history of present illness. Otherwise, complete review of systems is positive for none.  All other systems are reviewed and negative.   Physical Exam: VS:  BP (!) 153/73   Pulse 78   Ht 6' (1.829 m)   Wt 215 lb 9.6 oz (97.8 kg)   SpO2 98%   BMI 29.24 kg/m , BMI Body mass index is 29.24 kg/m.  Wt Readings from Last 3 Encounters:  02/04/23 215 lb 9.6 oz (97.8 kg)  04/30/22 212 lb 6.4 oz (96.3 kg)  02/26/22 209 lb (94.8 kg)    General: Patient appears comfortable at rest. HEENT: Conjunctiva and lids normal, oropharynx clear with moist mucosa. Neck: Supple, no elevated JVP or carotid bruits, no thyromegaly. Lungs: Clear to auscultation, nonlabored breathing at rest. Cardiac: Regular rate and rhythm, no S3 or significant systolic murmur, no pericardial rub. Abdomen: Soft, nontender, no hepatomegaly, bowel sounds present, no guarding or rebound. Extremities: No pitting edema, distal pulses 2+. Skin: Warm and dry. Musculoskeletal: No kyphosis. Neuropsychiatric: Alert and oriented x3, affect grossly appropriate.  Recent Labwork: 02/16/2022: BUN 17; Creatinine, Ser 1.11; Hemoglobin 13.1; Platelets 258; Potassium 4.0; Sodium 137     Component Value Date/Time   CHOL 187 02/13/2022 0452   TRIG 92 02/13/2022 0452   HDL 53 02/13/2022 0452   CHOLHDL 3.5 02/13/2022 0452   VLDL 18 02/13/2022 0452   LDLCALC 116 (H) 02/13/2022 0452    Other Studies Reviewed Today: Echocardiogram in 02/2022 LVEF normal Mild LVH RV systolic function is normal No valvular abnormalities  Assessment and Plan:  # Shortness of breath -SOB started after Brilinta use.   Worsening.  Last dose will be on 02/16/2023.  Encouraged patient to continue Brilinta till 02/16/2023 and stop.  Reevaluate symptoms after discontinuation of Brilinta.  Hopefully it will resolve.  If it does not resolve, he will need echocardiogram.  #  CAD manifested by NSTEMI in 02/2022 s/p OM PCI with normal LVEF, currently angina free -Continue DAPT (aspirin 81 mg once daily and Brilinta 90 mg twice daily) for total duration of 1 year.  Okay to stop Brilinta on 02/16/2023.  Continue aspirin indefinitely. -Restart rosuvastatin 20 mg nightly. -SL NG 0.4 mg as needed -ER precautions for chest pain  # HLD, not at goal -Restart rosuvastatin 20 mg nightly.  I educated the patient that statin initiation and his heart attack are purely coincidental, explained him the pathophysiology of heart attacks.  He agreed to restart rosuvastatin.  Goal LDL less than  70.  He has appointment with his PCP sometime next week for his annual physical, encouraged him to get his lipids checked as well. -He had normal lipoprotein a levels.  # HTN, partially controlled -Continue carvedilol 6.25 mg twice daily.  Does not check blood pressures at home.  Strongly encouraged to check blood pressures at home.    Medication Adjustments/Labs and Tests Ordered: Current medicines are reviewed at length with the patient today.  Concerns regarding medicines are outlined above.   Tests Ordered: Orders Placed This Encounter  Procedures   EKG 12-Lead    Medication Changes: No orders of the defined types were placed in this encounter.   Disposition:  Follow up 1 year  Signed, Jakim Drapeau Verne Spurr, MD, 02/04/2023 10:11 AM    [facility] at [facility] [address], [place], [place] [address]

## 2023-02-14 ENCOUNTER — Telehealth: Payer: Self-pay | Admitting: Internal Medicine

## 2023-02-14 NOTE — Telephone Encounter (Signed)
 Patient stated he wants to drop [employer] paperwork that he needs to have completed and faxed.  Patient wants a call when paperwork is complete so he can pick up a signed copy.

## 2023-02-14 NOTE — Telephone Encounter (Signed)
 Spoke with pt and he states that he will drop it off in the morning and the due date is 03/04/23.

## 2023-04-09 ENCOUNTER — Other Ambulatory Visit: Payer: Self-pay | Admitting: Internal Medicine

## 2023-04-12 ENCOUNTER — Other Ambulatory Visit: Payer: Self-pay | Admitting: Internal Medicine

## 2023-04-12 DIAGNOSIS — Z0000 Encounter for general adult medical examination without abnormal findings: Secondary | ICD-10-CM | POA: Diagnosis not present

## 2023-04-12 DIAGNOSIS — E7849 Other hyperlipidemia: Secondary | ICD-10-CM | POA: Diagnosis not present

## 2023-04-12 DIAGNOSIS — E538 Deficiency of other specified B group vitamins: Secondary | ICD-10-CM | POA: Diagnosis not present

## 2023-04-12 DIAGNOSIS — K219 Gastro-esophageal reflux disease without esophagitis: Secondary | ICD-10-CM | POA: Diagnosis not present

## 2023-04-12 DIAGNOSIS — I2510 Atherosclerotic heart disease of native coronary artery without angina pectoris: Secondary | ICD-10-CM | POA: Diagnosis not present

## 2023-04-12 DIAGNOSIS — E782 Mixed hyperlipidemia: Secondary | ICD-10-CM | POA: Diagnosis not present

## 2023-04-12 DIAGNOSIS — R7309 Other abnormal glucose: Secondary | ICD-10-CM | POA: Diagnosis not present

## 2023-07-02 ENCOUNTER — Other Ambulatory Visit: Payer: Self-pay | Admitting: Internal Medicine

## 2023-07-04 DIAGNOSIS — L814 Other melanin hyperpigmentation: Secondary | ICD-10-CM | POA: Diagnosis not present

## 2023-07-04 DIAGNOSIS — D225 Melanocytic nevi of trunk: Secondary | ICD-10-CM | POA: Diagnosis not present

## 2023-07-04 DIAGNOSIS — L821 Other seborrheic keratosis: Secondary | ICD-10-CM | POA: Diagnosis not present

## 2023-07-04 DIAGNOSIS — L570 Actinic keratosis: Secondary | ICD-10-CM | POA: Diagnosis not present

## 2024-01-19 ENCOUNTER — Other Ambulatory Visit: Payer: Self-pay | Admitting: Internal Medicine

## 2024-01-20 NOTE — Telephone Encounter (Signed)
 Pt of Dr. Mallipeddi  In accordance with refill protocols, please review and address the following requirements before this medication refill can be authorized:  Labs

## 2024-02-01 NOTE — Progress Notes (Signed)
 "  Cardiology Office Note    Date:  02/03/2024  ID:  Peter Williams, Peter Williams, Peter Williams, Peter Williams Cardiologist: Diannah SHAUNNA Maywood, MD { : History of Present Illness:    Peter Williams is a 69 y.o. male with past medical history of CAD (s/p NSTEMI in 02/2022 with DES to OM1 and balloon angioplasty to smaller and more medial branch of the OM1), HTN and HLD who presents to the office today for annual follow-up.  He was last examined by Dr. Maywood in 01/2023 and reported worsening shortness of breath since being on Brilinta . He denied any specific anginal pain. Was informed to stop Brilinta  on 02/16/2023 and to continue ASA indefinitely. Was restarted on Crestor  20 mg daily while being continued on Coreg  6.25 mg twice daily.  In talking with the patient today, he reports overall doing well since his last office visit. 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 Also reports remaining physically active around his home. He denies any recent chest pain or dyspnea on exertion. No recent palpitations, orthopnea, PND or pitting edema. He checks his blood pressure at home and this has overall been well-controlled. He does report easy bruising with ASA.  Studies Reviewed:   EKG: EKG is ordered today and demonstrates:   EKG Interpretation Date/Time:  Friday February 03 2024 12:56:16 EST Ventricular Rate:  81 PR Interval:  166 QRS Duration:  74 QT Interval:  370 QTC Calculation: 429 R Axis:   -10  Text Interpretation: Sinus rhythm with occasional Premature ventricular complexes Confirmed by Johnson Grate ([id number]) on 02/03/2024 1:04:10 PM       Echocardiogram: 02/2022 IMPRESSIONS     1. Left ventricular ejection fraction, by estimation, is 60 to 65%. The  left ventricle has normal function. The left ventricle has no regional  wall motion abnormalities. There is mild left ventricular hypertrophy.  Left  ventricular diastolic parameters  were normal.   2. Right ventricular systolic function is normal. The right ventricular  size is normal. Tricuspid regurgitation signal is inadequate for assessing  PA pressure.   3. The mitral valve is normal in structure. No evidence of mitral valve  regurgitation. No evidence of mitral stenosis.   4. The aortic valve was not well visualized. Aortic valve regurgitation  is not visualized. No aortic stenosis is present.   5. The inferior vena cava is normal in size with greater than 50%  respiratory variability, suggesting right atrial pressure of 3 mmHg.   Comparison(s): No prior Echocardiogram.    Cardiac Catheterization: 02/2022   Ost LAD to Prox LAD lesion is 25% stenosed.   Mid Cx lesion is 25% stenosed.   1st Mrg-1 lesion is 90% stenosed.  A drug-eluting stent was successfully placed using a SYNERGY XD 2.50X20, postdilated to greater than 3 mm in diameter.  The stent started in the OM proximal to the bifurcation and extended into the lateral branch of the OM, jailing the more medial branch.   Post intervention, there is a 0% residual stenosis.   Balloon angioplasty was performed using a BALLN SAPPHIRE 2.0X12 through the stent struts to restore flow to this branch.   Post intervention, there is a 10% residual stenosis.   The left ventricular systolic function is normal.   LV end diastolic pressure is normal.   The left ventricular ejection fraction is 55-65% by visual estimate.   There is no aortic valve  stenosis.   Successful PCI of the bifurcation lesion in the OM1 with stent to the main branch and balloon angioplasty to the smaller more medial branch of the OM1.  Continue dual antiplatelet therapy along with aggressive secondary prevention.     Results conveyed to the patient's wife, Arland.   Physical Exam:   VS:  BP (!) 128/58 (BP Location: Right Arm, Cuff Size: Normal)   Pulse 82   Ht 6' (1.829 m)   Wt 208 lb 9.6 oz (94.6 kg)   SpO2  96%   BMI 28.29 kg/m    Wt Readings from Last 3 Encounters:  02/03/24 208 lb 9.6 oz (94.6 kg)  02/04/23 215 lb 9.6 oz (97.8 kg)  04/30/22 212 lb 6.4 oz (96.3 kg)     GEN: Pleasant male appearing in no acute distress NECK: No JVD; No carotid bruits CARDIAC: RRR, no murmurs, rubs, gallops RESPIRATORY:  Clear to auscultation without rales, wheezing or rhonchi  ABDOMEN: Appears non-distended. No obvious abdominal masses. EXTREMITIES: No clubbing or cyanosis. No pitting edema.  Distal pedal pulses are 2+ bilaterally.   Assessment and Plan:   1. Coronary artery disease involving native coronary artery of native heart without angina pectoris - He did have an NSTEMI in 02/2022 with DES to OM1 and balloon angioplasty to a smaller and more medial branch of the OM1.  - He remains active at baseline and denies any recent anginal symptoms. Continue current medical therapy with ASA 81 mg daily, Coreg  6.25 mg twice daily and Crestor  20 mg daily.  2. Essential hypertension - BP is well-controlled at 125/58 during today's visit. Continue current medical therapy with Coreg  6.25 mg twice daily.  3. Hyperlipidemia LDL goal <70 - LDL was at 74 in 04/2023. He is planning to have repeat labs with his PCP within the next 1 to 2 months. Continue Crestor  20 mg daily. If LDL remains above goal, this could be further titrated to 40 mg daily.  Signed, Laymon CHRISTELLA Qua, PA-C   "

## 2024-02-03 ENCOUNTER — Ambulatory Visit: Attending: Student | Admitting: Student

## 2024-02-03 ENCOUNTER — Encounter: Payer: Self-pay | Admitting: Student

## 2024-02-03 VITALS — BP 128/58 | HR 82 | Ht 72.0 in | Wt 208.6 lb

## 2024-02-03 DIAGNOSIS — E785 Hyperlipidemia, unspecified: Secondary | ICD-10-CM | POA: Diagnosis not present

## 2024-02-03 DIAGNOSIS — I2510 Atherosclerotic heart disease of native coronary artery without angina pectoris: Secondary | ICD-10-CM | POA: Diagnosis not present

## 2024-02-03 DIAGNOSIS — I10 Essential (primary) hypertension: Secondary | ICD-10-CM

## 2024-02-03 MED ORDER — ROSUVASTATIN CALCIUM 20 MG PO TABS: 20.0000 mg | ORAL_TABLET | Freq: Every day | ORAL | 3 refills | Status: AC

## 2024-02-03 MED ORDER — NITROGLYCERIN 0.4 MG SL SUBL: 0.4000 mg | SUBLINGUAL_TABLET | SUBLINGUAL | 2 refills | Status: AC | PRN

## 2024-02-03 MED ORDER — ASPIRIN 81 MG PO TBEC: 81.0000 mg | DELAYED_RELEASE_TABLET | Freq: Every day | ORAL | 3 refills | Status: AC

## 2024-02-03 MED ORDER — CARVEDILOL 6.25 MG PO TABS: 6.2500 mg | ORAL_TABLET | Freq: Two times a day (BID) | ORAL | 2 refills | Status: AC

## 2024-02-03 NOTE — Patient Instructions (Signed)
 Medication Instructions:  Your physician recommends that you continue on your current medications as directed. Please refer to the Current Medication list given to you today.  *If you need a refill on your cardiac medications before your next appointment, please call your pharmacy*  Lab Work: None If you have labs (blood work) drawn today and your tests are completely normal, you will receive your results only by: MyChart Message (if you have MyChart) OR A paper copy in the mail If you have any lab test that is abnormal or we need to change your treatment, we will call you to review the results.  Testing/Procedures: None  Follow-Up: At [facility], you and your health needs are our priority.  As part of our continuing mission to provide you with exceptional heart care, our providers are all part of one team.  This team includes your primary Cardiologist (physician) and Advanced Practice Providers or APPs (Physician Assistants and Nurse Practitioners) who all work together to provide you with the care you need, when you need it.  Your next appointment:   1 year(s)  Provider:   You may see Vishnu P Mallipeddi, MD or one of the following Advanced Practice Providers on your designated Care Team:   Brittany Strader, PA-C  Scotesia [place], [place] Olivia Pavy, [place]     We recommend signing up for the patient portal called MyChart.  Sign up information is provided on this After Visit Summary.  MyChart is used to connect with patients for Virtual Visits (Telemedicine).  Patients are able to view lab/test results, encounter notes, upcoming appointments, etc.  Non-urgent messages can be sent to your provider as well.   To learn more about what you can do with MyChart, go to [email or web address].   Other Instructions Thank you for choosing [facility]!
